# Patient Record
Sex: Male | Born: 1970
Health system: Southern US, Community
[De-identification: ages and names within clinical notes are randomized; demographics above are authoritative.]

## PROBLEM LIST (undated history)

## (undated) DIAGNOSIS — J342 Deviated nasal septum: Secondary | ICD-10-CM

## (undated) DIAGNOSIS — M8580 Other specified disorders of bone density and structure, unspecified site: Secondary | ICD-10-CM

## (undated) DIAGNOSIS — K219 Gastro-esophageal reflux disease without esophagitis: Secondary | ICD-10-CM

## (undated) DIAGNOSIS — S82401B Unspecified fracture of shaft of right fibula, initial encounter for open fracture type I or II: Secondary | ICD-10-CM

## (undated) DIAGNOSIS — M1990 Unspecified osteoarthritis, unspecified site: Secondary | ICD-10-CM

## (undated) DIAGNOSIS — N400 Enlarged prostate without lower urinary tract symptoms: Secondary | ICD-10-CM

## (undated) DIAGNOSIS — R0600 Dyspnea, unspecified: Secondary | ICD-10-CM

## (undated) DIAGNOSIS — S82201B Unspecified fracture of shaft of right tibia, initial encounter for open fracture type I or II: Secondary | ICD-10-CM

## (undated) DIAGNOSIS — M069 Rheumatoid arthritis, unspecified: Secondary | ICD-10-CM

## (undated) DIAGNOSIS — E7800 Pure hypercholesterolemia, unspecified: Secondary | ICD-10-CM

## (undated) DIAGNOSIS — G4730 Sleep apnea, unspecified: Secondary | ICD-10-CM

## (undated) HISTORY — DX: Unspecified fracture of shaft of right fibula, initial encounter for open fracture type I or II: S82.401B

## (undated) HISTORY — DX: Unspecified fracture of shaft of right tibia, initial encounter for open fracture type I or II: S82.201B

## (undated) HISTORY — PX: KNEE ARTHROSCOPY W/ MENISCECTOMY: SHX1879

## (undated) HISTORY — DX: Pure hypercholesterolemia, unspecified: E78.00

## (undated) HISTORY — DX: Other specified disorders of bone density and structure, unspecified site: M85.80

## (undated) HISTORY — DX: Benign prostatic hyperplasia without lower urinary tract symptoms: N40.0

## (undated) HISTORY — DX: Unspecified osteoarthritis, unspecified site: M19.90

## (undated) HISTORY — DX: Deviated nasal septum: J34.2

## (undated) HISTORY — DX: Dyspnea, unspecified: R06.00

## (undated) HISTORY — DX: Gastro-esophageal reflux disease without esophagitis: K21.9

## (undated) HISTORY — DX: Rheumatoid arthritis, unspecified: M06.9

## (undated) HISTORY — DX: Sleep apnea, unspecified: G47.30

---

## 1999-07-21 ENCOUNTER — Encounter: Payer: Self-pay | Admitting: Family Medicine

## 1999-07-21 ENCOUNTER — Encounter: Admission: RE | Admit: 1999-07-21 | Discharge: 1999-07-21 | Payer: Self-pay | Admitting: Family Medicine

## 2000-09-18 ENCOUNTER — Encounter: Admission: RE | Admit: 2000-09-18 | Discharge: 2000-09-18 | Payer: Self-pay | Admitting: Rheumatology

## 2000-09-18 ENCOUNTER — Encounter: Payer: Self-pay | Admitting: Rheumatology

## 2001-09-18 ENCOUNTER — Encounter: Payer: Self-pay | Admitting: Emergency Medicine

## 2001-09-18 ENCOUNTER — Encounter: Payer: Self-pay | Admitting: Orthopedic Surgery

## 2001-09-18 ENCOUNTER — Inpatient Hospital Stay (HOSPITAL_COMMUNITY): Admission: AC | Admit: 2001-09-18 | Discharge: 2001-09-25 | Payer: Self-pay

## 2001-09-19 ENCOUNTER — Encounter: Payer: Self-pay | Admitting: Orthopedic Surgery

## 2001-09-19 ENCOUNTER — Encounter: Payer: Self-pay | Admitting: Surgery

## 2001-09-25 ENCOUNTER — Inpatient Hospital Stay (HOSPITAL_COMMUNITY)
Admission: AD | Admit: 2001-09-25 | Discharge: 2001-10-05 | Payer: Self-pay | Admitting: Physical Medicine & Rehabilitation

## 2002-04-11 HISTORY — PX: WRIST ARTHROSCOPY: SUR100

## 2002-04-12 ENCOUNTER — Encounter: Admission: RE | Admit: 2002-04-12 | Discharge: 2002-04-12 | Payer: Self-pay | Admitting: Rheumatology

## 2002-04-12 ENCOUNTER — Encounter: Payer: Self-pay | Admitting: Rheumatology

## 2002-07-01 ENCOUNTER — Ambulatory Visit (HOSPITAL_BASED_OUTPATIENT_CLINIC_OR_DEPARTMENT_OTHER): Admission: RE | Admit: 2002-07-01 | Discharge: 2002-07-01 | Payer: Self-pay | Admitting: Orthopedic Surgery

## 2002-10-09 ENCOUNTER — Encounter: Payer: Self-pay | Admitting: Rheumatology

## 2002-10-09 ENCOUNTER — Encounter: Admission: RE | Admit: 2002-10-09 | Discharge: 2002-10-09 | Payer: Self-pay | Admitting: Rheumatology

## 2002-12-06 ENCOUNTER — Encounter: Admission: RE | Admit: 2002-12-06 | Discharge: 2002-12-06 | Payer: Self-pay | Admitting: Rheumatology

## 2002-12-06 ENCOUNTER — Encounter: Payer: Self-pay | Admitting: Rheumatology

## 2004-12-21 ENCOUNTER — Encounter: Admission: RE | Admit: 2004-12-21 | Discharge: 2005-03-21 | Payer: Self-pay | Admitting: Family Medicine

## 2006-09-13 ENCOUNTER — Ambulatory Visit (HOSPITAL_BASED_OUTPATIENT_CLINIC_OR_DEPARTMENT_OTHER): Admission: RE | Admit: 2006-09-13 | Discharge: 2006-09-13 | Payer: Self-pay | Admitting: Orthopedic Surgery

## 2009-11-03 ENCOUNTER — Encounter: Admission: RE | Admit: 2009-11-03 | Discharge: 2009-11-03 | Payer: Self-pay | Admitting: Gastroenterology

## 2010-08-24 NOTE — Op Note (Signed)
NAME:  [name], Gregory NO.:  [id number]   MEDICAL RECORD NO.:  [id number]          PATIENT TYPE:  AMB   LOCATION:  DSC                          FACILITY:  [facility]   PHYSICIAN:  Harvie Junior, M.D.   DATE OF BIRTH:  May 16, 1970   DATE OF PROCEDURE:  09/13/2006  DATE OF DISCHARGE:                               OPERATIVE REPORT   PREOPERATIVE DIAGNOSES:  1. Posterior horn medial meniscal tear.  2. Anterior horn lateral meniscal tear.   POSTOPERATIVE DIAGNOSES:  1. Posterior horn medial meniscal tear.  2. Anterior horn lateral meniscal tear.   PRINCIPAL PROCEDURE:  1. Partial posterior horn medial meniscectomy.  2. Anterior horn lateral meniscectomy, partial.   SURGEON:  Harvie Junior, M.D.   ASSISTANT:  Marshia Ly, P.A.   ANESTHESIA:  General.   BRIEF HISTORY:  [name] is 40 year old male with a long history of  having had right knee pain.  Examination showed that he had a positive  McMurray with concern of a medial meniscal tear.  MRI was obtained which  showed that he had both medial and lateral meniscal tears.  We talked  about treatment options and ultimately felt that operative intervention  was the most appropriate course of action.  He does take chronic  methotrexate and Enbrel for his rheumatoid arthritis and we felt it  appropriate to stop these medications for a week while he was having  arthroscopy, so these were not taken the week of surgery.  He is brought  to the operating room for this procedure.   PROCEDURE:  The patient is brought to the operating room.  After  adequate anesthesia was obtained with general anesthetic, the patient  was placed up on the operating room table.  The right leg was prepped  and draped in the usual sterile fashion.  Following this, routine  arthroscopic examination of the knee revealed that there was an obvious  posterior horn medial meniscal tear, flap felt tear.  This was debrided  back to a smooth and  stable rim.   Attention was then turned to the lateral meniscus where a the anterior  horn of the lateral meniscus was obviously torn.  This was debrided with  a combination of straight-biting forceps, left hand side-biting forceps  mainly.  Meniscus was contoured down with suction shaver.  A probe was  used to make sure anterior-lateral was stable, posterior-medial was  stable, these were.  The articular cartilage was really probed at length  and felt to be stable and no real significant areas of breakdown.  ACL  looked great.  We did a thorough lavage of the knee with 6 liters of  normal saline irrigation.  The knee was then copiously irrigated and  suctioned dry.  The arthroscopic portals were  closed with a bandage and a sterile compression dressing was applied.  The patient was taken to the recovery room, was noted to be in  satisfactory condition.   ESTIMATED BLOOD LOSS:  None.      Harvie Junior, M.D.  Electronically Signed     JLG/MEDQ  D:  09/13/2006  T:  09/13/2006  Job:  [id number]
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
## 2010-08-27 NOTE — Op Note (Signed)
NAME:  Gregory [name], Gregory [name]                          ACCOUNT NO.:  [id number]   MEDICAL RECORD NO.:  [id number]                   PATIENT TYPE:  AMB   LOCATION:  DSC                                  FACILITY:   PHYSICIAN:  Gregory [name], M.D.                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   POSTOPERATIVE DIAGNOSIS:  Degenerative joint disease of the wrist with torn  scapholunate ligament and torn triangular fibrocartilage.   OPERATION/PROCEDURE:  Debridement of wrist with debridement of torn  scapholunate ligament, interosseous ligament, debridement of triangular  fibrocartilage and debridement of multiple osteocartilaginous frayed  surfaces with a complete synovectomy of the wrist.   SURGEON:  Gregory [name], M.D.   ASSISTANT:  Gregory [name], P.A.   ANESTHESIA:  General.   BRIEF HISTORY:  Gregory [name] is a 40 year old male with a long history of  having prolonged rheumatoid arthritis.  He ultimately has had significant  wrist pain which was treated and improved with cortisone injection but  recurred.  Because of continued complaints of pain, MRI was obtained by Dr.  Coral [name].  The MRI showed that he had scapholunate ligament interosseous  tears as well as triangular fibrocartilage tear and degeneration with pannus  formation.  It was felt that this was the cause of the wrist pain.  We  ultimately talked to him about the fact that this was related to his  rheumatoid arthritis and that although we could not cure the disease, we  thought we could make his symptoms much better.  He ultimately decided to  come to the operating room for fixation.   DESCRIPTION OF PROCEDURE:  The patient was brought to the operating room.  After adequate anesthesia was obtained  with general anesthesia, the patient  was placed on the operating table.  The left wrist was then prepped and  draped in the usual sterile fashion.  Following this, routine wrist  arthroscopy was undertaken of the left wrist after exsanguination of the  extremity, tourniquet was inflated to 250 mmHg.  Following this, there was  extensive synovitis which was found throughout the wrist.  This was invading  most of the ligamentous tissue.  The triangular fibrocartilage was obviously  torn.  The scapholunate ligament was obviously torn. At this point we just  basically started from the ulnar side of the wrist and made a portal, just a  6-U portal  and debrided the synovitis out of the ulnar gutter as well as  debridement of the triangular fibrocartilage was undertaken from this far  medial portal.  Following this, attention was turned towards the central  portion of the wrist where debridement was undertaken of synovitis from the  anterior and posterior portions of the wrist as well as addressing the  scapholunate interosseous ligament which was debrided.  There was  essentially no articular cartilage on the proximal aspect of the lunate or  the scaphoid.  It really was devoid of all articular cartilage.  At this  point the camera was changed into the third compartment. Debridement of the  radial side of the wrist was undertaken and extensive synovectomy of the  radial gutter up around the radial styloid osteocartilaginous loose bodies  were debrided from the area of the radioscaphoid  articulation.  Following this, the wrist was copiously irrigated and  suctioned dry.  __________  portals were closed with a stitch.  Sterile  compressive dressing was applied as well as a wrist plaster.  The patient  was taken to the recovery room and was noted to be in satisfactory  condition.  Estimated blood loss for the procedure was none.                                                Gregory [name],  M.D.    Gregory [name]  D:  07/01/2002  T:  07/01/2002  Job:  [id number]   cc:   Gregory [name]. Coral [name], M.D.  [address]  [address]  [place]  [place] [address]  Fax: [phone]

## 2011-01-27 LAB — POCT HEMOGLOBIN-HEMACUE
Hemoglobin: 16.1
Operator id: 116011

## 2011-12-07 ENCOUNTER — Other Ambulatory Visit: Payer: Self-pay | Admitting: Family Medicine

## 2011-12-07 ENCOUNTER — Ambulatory Visit
Admission: RE | Admit: 2011-12-07 | Discharge: 2011-12-07 | Disposition: A | Discharge status: Home / Self Care | Payer: 59 | Source: Ambulatory Visit | Attending: Family Medicine | Admitting: Family Medicine

## 2011-12-07 ENCOUNTER — Other Ambulatory Visit (HOSPITAL_COMMUNITY): Payer: Self-pay | Admitting: Family Medicine

## 2011-12-07 DIAGNOSIS — M545 Low back pain, unspecified: Secondary | ICD-10-CM

## 2011-12-07 DIAGNOSIS — R0602 Shortness of breath: Secondary | ICD-10-CM

## 2011-12-14 ENCOUNTER — Ambulatory Visit (HOSPITAL_COMMUNITY)
Admission: RE | Admit: 2011-12-14 | Discharge: 2011-12-14 | Disposition: A | Discharge status: Home / Self Care | Payer: 59 | Source: Ambulatory Visit | Attending: Family Medicine | Admitting: Family Medicine

## 2011-12-14 DIAGNOSIS — R0602 Shortness of breath: Secondary | ICD-10-CM | POA: Insufficient documentation

## 2011-12-14 LAB — PULMONARY FUNCTION TEST

## 2011-12-14 MED ORDER — ALBUTEROL SULFATE (5 MG/ML) 0.5% IN NEBU
2.5000 mg | INHALATION_SOLUTION | Freq: Once | RESPIRATORY_TRACT | Status: AC
  Administered 2011-12-14: 2.5 mg via RESPIRATORY_TRACT

## 2012-01-18 ENCOUNTER — Encounter: Payer: Self-pay | Admitting: Internal Medicine

## 2012-01-19 ENCOUNTER — Encounter: Payer: Self-pay | Admitting: Internal Medicine

## 2012-01-19 ENCOUNTER — Ambulatory Visit (INDEPENDENT_AMBULATORY_CARE_PROVIDER_SITE_OTHER): Payer: 59 | Admitting: Internal Medicine

## 2012-01-19 VITALS — BP 128/68 | HR 71 | Temp 98.2°F | Ht 72.0 in | Wt 195.4 lb

## 2012-01-19 DIAGNOSIS — R0600 Dyspnea, unspecified: Secondary | ICD-10-CM

## 2012-01-19 DIAGNOSIS — R0609 Other forms of dyspnea: Secondary | ICD-10-CM

## 2012-01-19 DIAGNOSIS — R0989 Other specified symptoms and signs involving the circulatory and respiratory systems: Secondary | ICD-10-CM

## 2012-01-19 NOTE — Progress Notes (Signed)
Subjective:    Patient ID: Gregory [name], male    DOB: [birth year], 41 y.o.   MRN: [id number]  HPI  PCP is Lupita Raider, MD Rheumatologist - Dr Zenovia Jordan  IOV.01/19/2012 3 41 year old with RA on methtotrexate and enbrel  Reports cc dyspnea. On and off x  1-2 years. Spring 2012 dx with hiatal hernia and nexium helped with GERD but not with dyspnea. Stable since onset but past  month started on dimesta for nose blockage (dr Chilton Greathouse a month ago and few days ago) and feels dyspnea improved. Dyspnea severity is mild-moderate. Dyspnea brought on by unclear factors but has noticed it on exercise and treadmill which he thinks is not undue dyspnea.   Admits that when he cannot mout breathe for instance during talking or eating dyspnea is worse. Dyspnea can even happen at rest, episodically but he is unclear about details on this. . There is no associated panic sensation. Symptoms are associated with mouth breathing   There is associated snoring, and gasping with apneic spells reported to ENT by his girl friend but this is improved by dimesta.  THere is no associated cough, wheeze, chest tightness, sputum, hemoptysis, edema, pnd, orthopnea or weight loss  RA - diagnosed 11 years ago and he is on enbrel and Mtx x 10 years without complications per hx  LABS  Walking desaturation test 06/29/2011 185 feet x 3 laps: did not desaturate below 88% but went from 98% to 91%., HR response 68/min to 92/min   PFT -- mild reduction in FVC but otherwise normal PFT data  12/14/11  Fev1 3.9L/87%  FVC 4.4L/77%  BD response 4% fev1  Ratio 89/111%  fef 25-75% 129%  TLC 6.8/93%  DLCO 32/83%     LABS Creat 1.3mg % 11/30/11  HGb 15gm% 11/30/11   CXR   - done in last 2 months at PMD office - per report normal   Past Medical History  Diagnosis Date  . Rheumatoid arthritis   . Osteopenia   . Hypercholesterolemia   . GERD (gastroesophageal reflux disease)   . Deviated septum      Family History    Problem Relation Age of Onset  . Hypertension Father      History   Social History  . Marital Status: Single    Spouse Name: N/A    Number of Children: N/A  . Years of Education: N/A   Occupational History  . Not on file.   Social History Main Topics  . Smoking status: Never Smoker   . Smokeless tobacco: Not on file  . Alcohol Use: No  . Drug Use: No  . Sexually Active: Not on file   Other Topics Concern  . Not on file   Social History Narrative  . No narrative on file     Allergies  Allergen Reactions  . Amoxicillin      Outpatient Prescriptions Prior to Visit  Medication Sig Dispense Refill  . alfuzosin (UROXATRAL) 10 MG 24 hr tablet Take 10 mg by mouth daily.      . Calcium Carbonate-Vitamin D (CALCIUM 600+D) 600-400 MG-UNIT per tablet Take 1 tablet by mouth daily.      Marland Kitchen etanercept (ENBREL) 50 MG/ML injection Inject 50 mg into the skin once a week.      . folic acid (FOLVITE) 1 MG tablet Take 1 mg by mouth daily.      Marland Kitchen gabapentin (NEURONTIN) 300 MG capsule Take 300 mg by mouth 3 (three) times daily.      Marland Kitchen  methotrexate (RHEUMATREX) 2.5 MG tablet Take 17.5 mg by mouth once a week. (take 7 tablets weekly)       Caution:Chemotherapy. Protect from light.      . Multiple Vitamins-Minerals (MULTIVITAMIN & MINERAL PO) Take 1 tablet by mouth daily.          Review of Systems  Constitutional: Negative for fever and unexpected weight change.  HENT: Positive for congestion. Negative for ear pain, nosebleeds, sore throat, rhinorrhea, sneezing, trouble swallowing, dental problem, postnasal drip and sinus pressure.   Eyes: Negative for redness and itching.  Respiratory: Positive for shortness of breath. Negative for cough, chest tightness and wheezing.   Cardiovascular: Negative for palpitations and leg swelling.  Gastrointestinal: Negative for nausea and vomiting.  Genitourinary: Negative for dysuria.  Musculoskeletal: Positive for joint swelling.  Skin: Negative for  rash.  Neurological: Negative for headaches.  Hematological: Does not bruise/bleed easily.  Psychiatric/Behavioral: Negative for dysphoric mood. The patient is not nervous/anxious.        Objective:   Physical Exam  Nursing note and vitals reviewed. Constitutional: He is oriented to person, place, and time. He appears well-developed and well-nourished. No distress.  HENT:  Head: Normocephalic and atraumatic.  Right Ear: External ear normal.  Left Ear: External ear normal.  Mouth/Throat: Oropharynx is clear and moist. No oropharyngeal exudate.  Eyes: Conjunctivae normal and EOM are normal. Pupils are equal, round, and reactive to light. Right eye exhibits no discharge. Left eye exhibits no discharge. No scleral icterus.  Neck: Normal range of motion. Neck supple. No JVD present. No tracheal deviation present. No thyromegaly present.  Cardiovascular: Normal rate, regular rhythm and intact distal pulses.  Exam reveals no gallop and no friction rub.   No murmur heard. Pulmonary/Chest: Effort normal and breath sounds normal. No respiratory distress. He has no wheezes. He has no rales. He exhibits no tenderness.  Abdominal: Soft. Bowel sounds are normal. He exhibits no distension and no mass. There is no tenderness. There is no rebound and no guarding.  Musculoskeletal: Normal range of motion. He exhibits no edema and no tenderness.  Lymphadenopathy:    He has no cervical adenopathy.  Neurological: He is alert and oriented to person, place, and time. He has normal reflexes. No cranial nerve deficit. Coordination normal.  Skin: Skin is warm and dry. No rash noted. He is not diaphoretic. No erythema. No pallor.  Psychiatric: He has a normal mood and affect. His behavior is normal. Judgment and thought content normal.          Assessment & Plan:  \

## 2012-01-19 NOTE — Patient Instructions (Addendum)
Unclear why you are having shortness of breath Please have methacholine challenge test for asthma After this test complete, call our office, and I will go over results with you over phone Based on these results, might have you come in or have pulmonary bike stress test

## 2012-01-20 DIAGNOSIS — R0600 Dyspnea, unspecified: Secondary | ICD-10-CM | POA: Insufficient documentation

## 2012-01-20 NOTE — Assessment & Plan Note (Signed)
/  Not sure why he is dyspneic. His disease predisposes him to restrictive chest cage if he has t spine RA or ILD but clinically these are less likely. He could have asthma. He is desiring clarification. Will get metahcholine challenge. If negative, then might need CPST v CT imaging

## 2012-01-25 ENCOUNTER — Ambulatory Visit (HOSPITAL_COMMUNITY)
Admission: RE | Admit: 2012-01-25 | Discharge: 2012-01-25 | Disposition: A | Discharge status: Home / Self Care | Payer: 59 | Source: Ambulatory Visit | Attending: Internal Medicine | Admitting: Internal Medicine

## 2012-01-25 DIAGNOSIS — R0989 Other specified symptoms and signs involving the circulatory and respiratory systems: Secondary | ICD-10-CM | POA: Insufficient documentation

## 2012-01-25 DIAGNOSIS — R0609 Other forms of dyspnea: Secondary | ICD-10-CM | POA: Insufficient documentation

## 2012-01-25 LAB — PULMONARY FUNCTION TEST

## 2012-01-25 MED ORDER — ALBUTEROL SULFATE (5 MG/ML) 0.5% IN NEBU
2.5000 mg | INHALATION_SOLUTION | Freq: Once | RESPIRATORY_TRACT | Status: AC
  Administered 2012-01-25: 2.5 mg via RESPIRATORY_TRACT

## 2012-01-25 MED ORDER — SODIUM CHLORIDE 0.9 % IN NEBU
3.0000 mL | INHALATION_SOLUTION | Freq: Once | RESPIRATORY_TRACT | Status: AC
  Administered 2012-01-25: 3 mL via RESPIRATORY_TRACT

## 2012-01-25 MED ORDER — METHACHOLINE 0.25 MG/ML NEB SOLN
2.0000 mL | Freq: Once | RESPIRATORY_TRACT | Status: AC
  Administered 2012-01-25: 0.5 mg via RESPIRATORY_TRACT

## 2012-01-25 MED ORDER — METHACHOLINE 16 MG/ML NEB SOLN
2.0000 mL | Freq: Once | RESPIRATORY_TRACT | Status: AC
  Administered 2012-01-25: 32 mg via RESPIRATORY_TRACT

## 2012-01-25 MED ORDER — METHACHOLINE 1 MG/ML NEB SOLN
2.0000 mL | Freq: Once | RESPIRATORY_TRACT | Status: AC
  Administered 2012-01-25: 2 mg via RESPIRATORY_TRACT

## 2012-01-25 MED ORDER — METHACHOLINE 0.0625 MG/ML NEB SOLN
2.0000 mL | Freq: Once | RESPIRATORY_TRACT | Status: AC
  Administered 2012-01-25: 0.125 mg via RESPIRATORY_TRACT

## 2012-01-25 MED ORDER — METHACHOLINE 4 MG/ML NEB SOLN
2.0000 mL | Freq: Once | RESPIRATORY_TRACT | Status: AC
  Administered 2012-01-25: 8 mg via RESPIRATORY_TRACT

## 2012-02-28 ENCOUNTER — Telehealth: Payer: Self-pay | Admitting: Internal Medicine

## 2012-02-28 DIAGNOSIS — R0600 Dyspnea, unspecified: Secondary | ICD-10-CM

## 2012-02-28 DIAGNOSIS — M069 Rheumatoid arthritis, unspecified: Secondary | ICD-10-CM

## 2012-02-28 DIAGNOSIS — J849 Interstitial pulmonary disease, unspecified: Secondary | ICD-10-CM

## 2012-02-28 NOTE — Telephone Encounter (Signed)
Please let him know MCT 01/25/12 is normal. I have ordered High REs CT chest. Will call with results. IF negatie, will need CPST

## 2012-02-28 NOTE — Telephone Encounter (Signed)
Pt advised and order has been placed. Jennifer Castillo, CMA  

## 2012-03-01 ENCOUNTER — Other Ambulatory Visit: Payer: 59

## 2012-03-07 ENCOUNTER — Telehealth: Payer: Self-pay | Admitting: Internal Medicine

## 2012-03-07 ENCOUNTER — Ambulatory Visit (INDEPENDENT_AMBULATORY_CARE_PROVIDER_SITE_OTHER)
Admission: RE | Admit: 2012-03-07 | Discharge: 2012-03-07 | Disposition: A | Discharge status: Home / Self Care | Payer: 59 | Source: Ambulatory Visit | Attending: Internal Medicine | Admitting: Internal Medicine

## 2012-03-07 DIAGNOSIS — R0600 Dyspnea, unspecified: Secondary | ICD-10-CM

## 2012-03-07 DIAGNOSIS — J849 Interstitial pulmonary disease, unspecified: Secondary | ICD-10-CM

## 2012-03-07 DIAGNOSIS — R0609 Other forms of dyspnea: Secondary | ICD-10-CM

## 2012-03-07 DIAGNOSIS — R0989 Other specified symptoms and signs involving the circulatory and respiratory systems: Secondary | ICD-10-CM

## 2012-03-07 DIAGNOSIS — M069 Rheumatoid arthritis, unspecified: Secondary | ICD-10-CM

## 2012-03-07 DIAGNOSIS — J8410 Pulmonary fibrosis, unspecified: Secondary | ICD-10-CM

## 2012-03-07 NOTE — Telephone Encounter (Signed)
Ct Chest Wo Contrast  03/07/2012  *RADIOLOGY REPORT*  Clinical Data: Interstitial lung disease.  Shortness of breath, rheumatoid arthritis.  CT CHEST WITHOUT CONTRAST  Technique:  Multidetector CT imaging of the chest was performed following the standard protocol without IV contrast.  Comparison: None.  Findings: No pathologically enlarged mediastinal, hilar or axillary lymph nodes.  Heart size normal.  No pericardial effusion.  Lungs are clear.  No subpleural reticulation, traction bronchiectasis/bronchiolectasis, architectural distortion or honeycombing.  No air trapping on inspiratory and expiratory imaging.  No pleural fluid.  Airway is unremarkable.  Incidental imaging of the upper abdomen shows a tiny stone in the left kidney.  No obstruction.  No worrisome lytic or sclerotic lesions.  IMPRESSION:  1.  No evidence of interstitial lung disease.  No findings to explain the patient's shortness of breath. 2.  Tiny left renal stone.   Original Report Authenticated By: Leanna Battles, M.D.     Chest chest normal though there is inidental finding of tiny left kidney stone that he needs to talk to pmd about. No explaination for dyspnea. Needs CPST - ordered  Pleaste tell him abov and give appt to see me after cPST  Thanks MR

## 2012-03-16 NOTE — Telephone Encounter (Signed)
Pt is advised and wants to call back to schedule follow-up when he has his calender with him. Carron Curie, CMA

## 2012-03-28 ENCOUNTER — Ambulatory Visit (HOSPITAL_COMMUNITY): Payer: 59 | Attending: Internal Medicine

## 2012-03-28 DIAGNOSIS — R0609 Other forms of dyspnea: Secondary | ICD-10-CM | POA: Insufficient documentation

## 2012-03-28 DIAGNOSIS — R0600 Dyspnea, unspecified: Secondary | ICD-10-CM

## 2012-03-28 DIAGNOSIS — R0989 Other specified symptoms and signs involving the circulatory and respiratory systems: Secondary | ICD-10-CM | POA: Insufficient documentation

## 2012-04-05 DIAGNOSIS — R0602 Shortness of breath: Secondary | ICD-10-CM

## 2012-04-26 ENCOUNTER — Telehealth: Payer: Self-pay | Admitting: Internal Medicine

## 2012-04-26 NOTE — Telephone Encounter (Signed)
Pt is request CPST results done 04/05/12. Please advise MR thanks

## 2012-04-27 NOTE — Telephone Encounter (Signed)
He needs to come into discuss cpst results; kind of complicated to discuss over phone due to techniaclity of discussing the results thought rsults not alarming.We told him to followup but he said he has to look at his calendar and call back but he never did. Please give fu first avail to discuss

## 2012-04-27 NOTE — Telephone Encounter (Signed)
Called and spoke with pt and he is aware of appt scheduled to see MR on 1-30 to discuss CPST results.    Nothing further is needed.

## 2012-05-10 ENCOUNTER — Encounter: Payer: Self-pay | Admitting: Internal Medicine

## 2012-05-10 ENCOUNTER — Ambulatory Visit (INDEPENDENT_AMBULATORY_CARE_PROVIDER_SITE_OTHER): Payer: 59 | Admitting: Internal Medicine

## 2012-05-10 VITALS — BP 114/80 | HR 83 | Temp 98.2°F | Ht 72.0 in | Wt 192.4 lb

## 2012-05-10 DIAGNOSIS — R0989 Other specified symptoms and signs involving the circulatory and respiratory systems: Secondary | ICD-10-CM

## 2012-05-10 DIAGNOSIS — R0609 Other forms of dyspnea: Secondary | ICD-10-CM

## 2012-05-10 DIAGNOSIS — R0600 Dyspnea, unspecified: Secondary | ICD-10-CM

## 2012-05-10 NOTE — Progress Notes (Signed)
Subjective:    Patient ID: Gregory [name], male    DOB: 1970-11-26, 42 y.o.   MRN: [id number]  HPI PCP is Lupita Raider, MD Rheumatologist - Dr Zenovia Jordan  IOV.01/19/2012 [age] year old with RA on methtotrexate and enbrel  Reports cc dyspnea. On and off x  1-2 years. Spring 2012 dx with hiatal hernia and nexium helped with GERD but not with dyspnea. Stable since onset but past  month started on dimesta for nose blockage (dr Chilton Greathouse a month ago and few days ago) and feels dyspnea improved. Dyspnea severity is mild-moderate. Dyspnea brought on by unclear factors but has noticed it on exercise and treadmill which he thinks is not undue dyspnea.   Admits that when he cannot mout breathe for instance during talking or eating dyspnea is worse. Dyspnea can even happen at rest, episodically but he is unclear about details on this. . There is no associated panic sensation. Symptoms are associated with mouth breathing   There is associated snoring, and gasping with apneic spells reported to ENT by his girl friend but this is improved by dimesta.  THere is no associated cough, wheeze, chest tightness, sputum, hemoptysis, edema, pnd, orthopnea or weight loss  RA - diagnosed 11 years ago and he is on enbrel and Mtx x 10 years without complications per hx  LABS  Walking desaturation test 06/29/2011 185 feet x 3 laps: did not desaturate below 88% but went from 98% to 91%., HR response 68/min to 92/min   PFT -- mild reduction in FVC but otherwise normal PFT data  12/14/11  Fev1 3.9L/87%  FVC 4.4L/77%  BD response 4% fev1  Ratio 89/111%  fef 25-75% 129%  TLC 6.8/93%  DLCO 32/83%     LABS Creat 1.3mg % 11/30/11  HGb 15gm% 11/30/11   CXR   - done in last 2 months at [facility]D office - per report normal   Unclear why you are having shortness of breath  Please have methacholine challenge test for asthma  After this test complete, call our office, and I will go over results with you over phone   Based on these results, might have you come in or have pulmonary bike stress test   OV 05/10/2012  Returns for followup of dyspnea to discuss test results  - Please let him know MCT 01/25/12 is normal. So no evidence of asthma - CT scan of the chest 03/03/2012:   IMPRESSION:  1.  No evidence of interstitial lung disease.  No findings to explain the patient's shortness of breath. 2.  Tiny left renal stone.   Original Report Authenticated By: Leanna Battles, M.D.    - Get cardiopulmonary stress test:    - This test was abnormal. He again had a resting low forced vital capacity build mildly low. His effort was slightly submaximal but if one were to discount that and consider that he gave a good normal effort differential diagnosis is either deconditioning because he could not mount a good heart rate response and stopped or neuromuscular weakness as evidenced on basis of his forced vital capacity  - In interim his dyspnea persists he says that even while talking to me he feels that he needs to take a good deep breaths periodically  Review of Systems  Constitutional: Negative for fever and unexpected weight change.  HENT: Negative for ear pain, nosebleeds, congestion, sore throat, rhinorrhea, sneezing, trouble swallowing, dental problem, postnasal drip and sinus pressure.   Eyes: Negative for redness and itching.  Respiratory: Positive for shortness of breath. Negative for cough, chest tightness and wheezing.   Cardiovascular: Negative for palpitations and leg swelling.  Gastrointestinal: Negative for nausea and vomiting.  Genitourinary: Negative for dysuria.  Musculoskeletal: Negative for joint swelling.  Skin: Negative for rash.  Neurological: Negative for headaches.  Hematological: Does not bruise/bleed easily.  Psychiatric/Behavioral: Negative for dysphoric mood. The patient is not nervous/anxious.        Objective:   Physical Exam  Discussion only visit He is alert and  anxious       Assessment & Plan:

## 2012-05-10 NOTE — Assessment & Plan Note (Addendum)
Please let him know MCT 01/25/12 is normal. So no evidence of asthma - CT scan of the chest 03/03/2012:   IMPRESSION:  1.  No evidence of interstitial lung disease.  No findings to explain the patient's shortness of breath. 2.  Tiny left renal stone.   Original Report Authenticated By: Leanna Battles, M.D.    - Get cardiopulmonary stress test:    - This test was abnormal. He again had a resting low forced vital capacity build mildly low. His effort was slightly submaximal but if one were to discount that and consider that he gave a good normal effort differential diagnosis is either deconditioning because he could not mount a good heart rate response and stopped or neuromuscular weakness as evidenced on basis of his forced vital capacity   We discussed extensively the implications of these results. I've explained to him that the proper differential diagnosis is neuromuscular weakness either due to rheumatoid arthritis itself or possible drug interaction or  new neuromuscular disease. Alternative, is that he is deconditioned or has early coronary artery disease. Really doubt that he has early heart disease due to normal cardiac performance and the stress test.   See neurology consultation first and return for followup. His neurology evaluation is negative then we'll refer to pulmonary rehabilitation  Agreeable with the above plan   > 50% of this > 25 min visit spent in face to face counseling (15 min visit converted to 25 min)

## 2012-05-10 NOTE — Patient Instructions (Addendum)
Please see Neurology for ruling out neuromuscular issues causing your shortness of breath Return in 2 months Ok to go to gym and work out

## 2012-07-09 ENCOUNTER — Encounter: Payer: Self-pay | Admitting: Internal Medicine

## 2012-07-09 ENCOUNTER — Ambulatory Visit (INDEPENDENT_AMBULATORY_CARE_PROVIDER_SITE_OTHER): Payer: 59 | Admitting: Internal Medicine

## 2012-07-09 VITALS — BP 126/82 | HR 63 | Temp 98.1°F | Ht 72.0 in | Wt 192.0 lb

## 2012-07-09 DIAGNOSIS — R0609 Other forms of dyspnea: Secondary | ICD-10-CM

## 2012-07-09 DIAGNOSIS — R0600 Dyspnea, unspecified: Secondary | ICD-10-CM

## 2012-07-09 DIAGNOSIS — R0989 Other specified symptoms and signs involving the circulatory and respiratory systems: Secondary | ICD-10-CM

## 2012-07-09 NOTE — Assessment & Plan Note (Signed)
He is reporting resolution of dyspnea with self-directed exercise program. He still unsettled as to why he had dyspnea in the first place. We discussed the hypothesis of deviated blocked nasal septum causing dyspnea. His dural muscle evaluation is reportedly negative. He also feels that stress or acid reflux might have contributed to dyspnea. At this point in time no further followup is needed. We discussed that rheumatoid arthritis, methotrexate and possibly acid reflux might play etiological roles in future development of pulmonary fibrosis but there is no way to predict this order to surveillance. I did indicate to him that he should be in constant touch with his rheumatologist and monitor himself for methotrexate toxicity which over long time can affect the lungs. He is in agreement with this plan. He will watch his dyspnea and if it recurs he will return to the office. At this time I'm not to do any further followup  (> 50% of this 15 min visit spent in face to face counseling)

## 2012-07-09 NOTE — Patient Instructions (Addendum)
Glad you are better We discussed risk factors for pulmonary fibrosis - RA, Methotrexate and maybe acid reflux Monitor your symptoms REturn as needed

## 2012-07-09 NOTE — Progress Notes (Signed)
Subjective:    Patient ID: Gregory [name], male    DOB: Aug 02, 1970, 42 y.o.   MRN: [id number]  HPI PCP is Lupita Raider, MD Rheumatologist - Dr Zenovia Jordan  IOV.01/19/2012 [age] year old with RA on methtotrexate and enbrel  Reports cc dyspnea. On and off x  1-2 years. Spring 2012 dx with hiatal hernia and nexium helped with GERD but not with dyspnea. Stable since onset but past  month started on dimesta for nose blockage (dr Chilton Greathouse a month ago and few days ago) and feels dyspnea improved. Dyspnea severity is mild-moderate. Dyspnea brought on by unclear factors but has noticed it on exercise and treadmill which he thinks is not undue dyspnea.   Admits that when he cannot mout breathe for instance during talking or eating dyspnea is worse. Dyspnea can even happen at rest, episodically but he is unclear about details on this. . There is no associated panic sensation. Symptoms are associated with mouth breathing   There is associated snoring, and gasping with apneic spells reported to ENT by his girl friend but this is improved by dimesta.  THere is no associated cough, wheeze, chest tightness, sputum, hemoptysis, edema, pnd, orthopnea or weight loss  RA - diagnosed 11 years ago and he is on enbrel and Mtx x 10 years without complications per hx  LABS  Walking desaturation test 06/29/2011 185 feet x 3 laps: did not desaturate below 88% but went from 98% to 91%., HR response 68/min to 92/min   PFT -- mild reduction in FVC but otherwise normal PFT data  12/14/11  Fev1 3.9L/87%  FVC 4.4L/77%  BD response 4% fev1  Ratio 89/111%  fef 25-75% 129%  TLC 6.8/93%  DLCO 32/83%     LABS Creat 1.3mg % 11/30/11  HGb 15gm% 11/30/11   CXR   - done in last 2 months at [facility]D office - per report normal   Unclear why you are having shortness of breath  Please have methacholine challenge test for asthma  After this test complete, call our office, and I will go over results with you over phone   Based on these results, might have you come in or have pulmonary bike stress test   OV 05/10/2012  Returns for followup of dyspnea to discuss test results  - Please let him know MCT 01/25/12 is normal. So no evidence of asthma - CT scan of the chest 03/03/2012:   IMPRESSION:  1.  No evidence of interstitial lung disease.  No findings to explain the patient's shortness of breath. 2.  Tiny left renal stone.   Original Report Authenticated By: Leanna Battles, M.D.    - Get cardiopulmonary stress test:    - This test was abnormal. He again had a resting low forced vital capacity build mildly low. His effort was slightly submaximal but if one were to discount that and consider that he gave a good normal effort differential diagnosis is either deconditioning because he could not mount a good heart rate response and stopped or neuromuscular weakness as evidenced on basis of his forced vital capacity  - In interim his dyspnea persists he says that even while talking to me he feels that he needs to take a good deep breaths periodically   REC Please see Neurology for ruling out neuromuscular issues causing your shortness of breath  Return in 2 months  Ok to go to gym and work out   OV 07/09/2012   Followup dyspnea in the setting of methotrexate  and rheumatoid arthritis.  pulmonary stress test was normal  Since last visit he is now working out at the gym in his office. He says that his dyspnea is improved and almost resolved. He did see neurology and he has been cleared of neuromuscular disease. He is still somewhat unsettled as to why he had dyspnea. We did discuss that he had deviated nasal septum and a blocked nose that might be constituting to sensation of dyspnea. In addition he feels that his personal social stress levels might be constituting to dyspnea. He has acid reflux and is trying to diet to reduce the intensity of that and he wonders if this might be the cause of dyspnea. We discussed  the role of methotrexate, rheumatoid arthritis and acid reflux and the possible role of future pulmonary fibrosis for which he is at risk but currently this is not the cause for dyspnea   Past, Family, Social reviewed: no change since last visit      Review of Systems  Constitutional: Negative for fever and unexpected weight change.  HENT: Negative for ear pain, nosebleeds, congestion, sore throat, rhinorrhea, sneezing, trouble swallowing, dental problem, postnasal drip and sinus pressure.   Eyes: Negative for redness and itching.  Respiratory: Negative for cough, chest tightness, shortness of breath and wheezing.   Cardiovascular: Negative for palpitations and leg swelling.  Gastrointestinal: Negative for nausea and vomiting.  Genitourinary: Negative for dysuria.  Musculoskeletal: Negative for joint swelling.  Skin: Negative for rash.  Neurological: Negative for headaches.  Hematological: Does not bruise/bleed easily.  Psychiatric/Behavioral: Negative for dysphoric mood. The patient is not nervous/anxious.        Objective:   Physical Exam  Nursing note and vitals reviewed. Constitutional: He is oriented to person, place, and time. He appears well-developed and well-nourished. No distress.  HENT:  Head: Normocephalic and atraumatic.  Right Ear: External ear normal.  Left Ear: External ear normal.  Mouth/Throat: Oropharynx is clear and moist. No oropharyngeal exudate.  Deviated nasal septum  Eyes: Conjunctivae and EOM are normal. Pupils are equal, round, and reactive to light. Right eye exhibits no discharge. Left eye exhibits no discharge. No scleral icterus.  Neck: Normal range of motion. Neck supple. No JVD present. No tracheal deviation present. No thyromegaly present.  Cardiovascular: Normal rate, regular rhythm and intact distal pulses.  Exam reveals no gallop and no friction rub.   No murmur heard. Pulmonary/Chest: Effort normal and breath sounds normal. No respiratory  distress. He has no wheezes. He has no rales. He exhibits no tenderness.  Abdominal: Soft. Bowel sounds are normal. He exhibits no distension and no mass. There is no tenderness. There is no rebound and no guarding.  Musculoskeletal: Normal range of motion. He exhibits no edema and no tenderness.  Lymphadenopathy:    He has no cervical adenopathy.  Neurological: He is alert and oriented to person, place, and time. He has normal reflexes. No cranial nerve deficit. Coordination normal.  Skin: Skin is warm and dry. No rash noted. He is not diaphoretic. No erythema. No pallor.  Psychiatric: He has a normal mood and affect. His behavior is normal. Judgment and thought content normal.  Mildly anxious          Assessment & Plan:

## 2015-05-27 ENCOUNTER — Other Ambulatory Visit: Payer: Self-pay | Admitting: Family Medicine

## 2015-05-27 ENCOUNTER — Ambulatory Visit
Admission: RE | Admit: 2015-05-27 | Discharge: 2015-05-27 | Disposition: A | Discharge status: Home / Self Care | Payer: 59 | Source: Ambulatory Visit | Attending: Family Medicine | Admitting: Family Medicine

## 2015-05-27 DIAGNOSIS — A5202 Syphilitic aortitis: Secondary | ICD-10-CM

## 2015-05-27 DIAGNOSIS — R6882 Decreased libido: Secondary | ICD-10-CM | POA: Insufficient documentation

## 2015-06-23 DIAGNOSIS — M8580 Other specified disorders of bone density and structure, unspecified site: Secondary | ICD-10-CM | POA: Insufficient documentation

## 2015-06-23 DIAGNOSIS — R7989 Other specified abnormal findings of blood chemistry: Secondary | ICD-10-CM | POA: Insufficient documentation

## 2015-06-23 DIAGNOSIS — E782 Mixed hyperlipidemia: Secondary | ICD-10-CM | POA: Insufficient documentation

## 2015-06-23 DIAGNOSIS — K219 Gastro-esophageal reflux disease without esophagitis: Secondary | ICD-10-CM | POA: Insufficient documentation

## 2015-06-23 DIAGNOSIS — K5900 Constipation, unspecified: Secondary | ICD-10-CM | POA: Insufficient documentation

## 2015-06-23 DIAGNOSIS — M069 Rheumatoid arthritis, unspecified: Secondary | ICD-10-CM | POA: Insufficient documentation

## 2015-06-23 DIAGNOSIS — N400 Enlarged prostate without lower urinary tract symptoms: Secondary | ICD-10-CM | POA: Insufficient documentation

## 2015-06-24 ENCOUNTER — Encounter: Payer: Self-pay | Admitting: Cardiology

## 2015-06-24 ENCOUNTER — Ambulatory Visit (INDEPENDENT_AMBULATORY_CARE_PROVIDER_SITE_OTHER): Payer: 59 | Admitting: Cardiology

## 2015-06-24 VITALS — BP 118/80 | HR 70 | Ht 72.0 in | Wt 201.8 lb

## 2015-06-24 DIAGNOSIS — E785 Hyperlipidemia, unspecified: Secondary | ICD-10-CM | POA: Diagnosis not present

## 2015-06-24 DIAGNOSIS — R0600 Dyspnea, unspecified: Secondary | ICD-10-CM

## 2015-06-24 DIAGNOSIS — K219 Gastro-esophageal reflux disease without esophagitis: Secondary | ICD-10-CM

## 2015-06-24 DIAGNOSIS — E7849 Other hyperlipidemia: Secondary | ICD-10-CM

## 2015-06-24 DIAGNOSIS — R079 Chest pain, unspecified: Secondary | ICD-10-CM | POA: Diagnosis not present
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
## 2015-06-24 NOTE — Patient Instructions (Addendum)
Medication Instructions:  The current medical regimen is effective;  continue present plan and medications.  Testing/Procedures: Your physician has requested that you have an exercise tolerance test. For further information please visit [email or web address]. Please also follow instruction sheet, as given.  Your physician has requested that you have an echocardiogram. Echocardiography is a painless test that uses sound waves to create images of your heart. It provides your doctor with information about the size and shape of your heart and how well your heart's chambers and valves are working. This procedure takes approximately one hour. There are no restrictions for this procedure.  Follow-Up: Follow up in 1 year with Dr. Marlou Porch.  You will receive a letter in the mail 2 months before you are due.  Please call us when you receive this letter to schedule your follow up appointment.  If you need a refill on your cardiac medications before your next appointment, please call your pharmacy.  Thank you for choosing [facility]!!

## 2015-07-14 ENCOUNTER — Other Ambulatory Visit: Payer: Self-pay

## 2015-07-14 ENCOUNTER — Ambulatory Visit (HOSPITAL_COMMUNITY): Payer: 59 | Attending: Cardiology

## 2015-07-14 ENCOUNTER — Ambulatory Visit (INDEPENDENT_AMBULATORY_CARE_PROVIDER_SITE_OTHER): Payer: 59

## 2015-07-14 DIAGNOSIS — E785 Hyperlipidemia, unspecified: Secondary | ICD-10-CM | POA: Insufficient documentation

## 2015-07-14 DIAGNOSIS — R079 Chest pain, unspecified: Secondary | ICD-10-CM

## 2015-07-14 DIAGNOSIS — I340 Nonrheumatic mitral (valve) insufficiency: Secondary | ICD-10-CM | POA: Insufficient documentation

## 2015-07-14 DIAGNOSIS — Z8249 Family history of ischemic heart disease and other diseases of the circulatory system: Secondary | ICD-10-CM | POA: Insufficient documentation

## 2015-07-14 LAB — EXERCISE TOLERANCE TEST
Estimated workload: 13.4 METS
Exercise duration (min): 11 min
Exercise duration (sec): 0 s
MPHR: 177 {beats}/min
Peak HR: 166 {beats}/min
Percent HR: 95 %
Percent of predicted max HR: 93 %
RPE: 19
Rest HR: 68 {beats}/min
Stage 1 DBP: 75 mmHg
Stage 1 Grade: 0 %
Stage 1 HR: 82 {beats}/min
Stage 1 SBP: 113 mmHg
Stage 1 Speed: 0 mph
Stage 2 Grade: 0 %
Stage 2 HR: 90 {beats}/min
Stage 2 Speed: 0.8 mph
Stage 3 Grade: 0 %
Stage 3 HR: 90 {beats}/min
Stage 3 Speed: 1 mph
Stage 4 Grade: 10 %
Stage 4 HR: 101 {beats}/min
Stage 4 Speed: 1.7 mph
Stage 5 DBP: 68 mmHg
Stage 5 Grade: 12 %
Stage 5 HR: 118 {beats}/min
Stage 5 SBP: 123 mmHg
Stage 5 Speed: 2.5 mph
Stage 6 DBP: 71 mmHg
Stage 6 Grade: 14 %
Stage 6 HR: 134 {beats}/min
Stage 6 SBP: 147 mmHg
Stage 6 Speed: 3.4 mph
Stage 7 Grade: 16 %
Stage 7 HR: 166 {beats}/min
Stage 7 Speed: 4.2 mph
Stage 8 DBP: 63 mmHg
Stage 8 Grade: 0 %
Stage 8 HR: 137 {beats}/min
Stage 8 SBP: 133 mmHg
Stage 8 Speed: 1.5 mph
Stage 9 DBP: 77 mmHg
Stage 9 Grade: 0 %
Stage 9 HR: 108 {beats}/min
Stage 9 SBP: 132 mmHg
Stage 9 Speed: 0 mph

## 2016-05-25 DIAGNOSIS — M0579 Rheumatoid arthritis with rheumatoid factor of multiple sites without organ or systems involvement: Secondary | ICD-10-CM | POA: Diagnosis not present

## 2016-05-25 DIAGNOSIS — M2550 Pain in unspecified joint: Secondary | ICD-10-CM | POA: Diagnosis not present

## 2016-05-25 DIAGNOSIS — Z79899 Other long term (current) drug therapy: Secondary | ICD-10-CM | POA: Diagnosis not present

## 2016-06-01 DIAGNOSIS — Z79899 Other long term (current) drug therapy: Secondary | ICD-10-CM | POA: Diagnosis not present

## 2016-06-01 DIAGNOSIS — M069 Rheumatoid arthritis, unspecified: Secondary | ICD-10-CM | POA: Diagnosis not present

## 2016-06-01 DIAGNOSIS — E782 Mixed hyperlipidemia: Secondary | ICD-10-CM | POA: Diagnosis not present

## 2016-06-01 DIAGNOSIS — Z0000 Encounter for general adult medical examination without abnormal findings: Secondary | ICD-10-CM | POA: Diagnosis not present

## 2016-07-11 DIAGNOSIS — H524 Presbyopia: Secondary | ICD-10-CM | POA: Diagnosis not present

## 2016-08-31 DIAGNOSIS — Z79899 Other long term (current) drug therapy: Secondary | ICD-10-CM | POA: Diagnosis not present

## 2016-08-31 DIAGNOSIS — M2550 Pain in unspecified joint: Secondary | ICD-10-CM | POA: Diagnosis not present

## 2016-08-31 DIAGNOSIS — M0579 Rheumatoid arthritis with rheumatoid factor of multiple sites without organ or systems involvement: Secondary | ICD-10-CM | POA: Diagnosis not present

## 2016-08-31 DIAGNOSIS — E782 Mixed hyperlipidemia: Secondary | ICD-10-CM | POA: Diagnosis not present

## 2016-12-05 DIAGNOSIS — M79641 Pain in right hand: Secondary | ICD-10-CM | POA: Diagnosis not present

## 2016-12-05 DIAGNOSIS — Z79899 Other long term (current) drug therapy: Secondary | ICD-10-CM | POA: Diagnosis not present

## 2016-12-05 DIAGNOSIS — M2550 Pain in unspecified joint: Secondary | ICD-10-CM | POA: Diagnosis not present

## 2016-12-05 DIAGNOSIS — M0579 Rheumatoid arthritis with rheumatoid factor of multiple sites without organ or systems involvement: Secondary | ICD-10-CM | POA: Diagnosis not present

## 2016-12-05 DIAGNOSIS — M79642 Pain in left hand: Secondary | ICD-10-CM | POA: Diagnosis not present

## 2017-03-07 DIAGNOSIS — Z79899 Other long term (current) drug therapy: Secondary | ICD-10-CM | POA: Diagnosis not present

## 2017-03-07 DIAGNOSIS — M2550 Pain in unspecified joint: Secondary | ICD-10-CM | POA: Diagnosis not present

## 2017-03-07 DIAGNOSIS — M0579 Rheumatoid arthritis with rheumatoid factor of multiple sites without organ or systems involvement: Secondary | ICD-10-CM | POA: Diagnosis not present

## 2017-06-05 DIAGNOSIS — Z79899 Other long term (current) drug therapy: Secondary | ICD-10-CM | POA: Diagnosis not present

## 2017-06-05 DIAGNOSIS — M0579 Rheumatoid arthritis with rheumatoid factor of multiple sites without organ or systems involvement: Secondary | ICD-10-CM | POA: Diagnosis not present

## 2017-06-05 DIAGNOSIS — M2550 Pain in unspecified joint: Secondary | ICD-10-CM | POA: Diagnosis not present

## 2017-06-07 DIAGNOSIS — Z0000 Encounter for general adult medical examination without abnormal findings: Secondary | ICD-10-CM | POA: Diagnosis not present

## 2017-06-07 DIAGNOSIS — E782 Mixed hyperlipidemia: Secondary | ICD-10-CM | POA: Diagnosis not present

## 2017-06-07 DIAGNOSIS — Z131 Encounter for screening for diabetes mellitus: Secondary | ICD-10-CM | POA: Diagnosis not present

## 2017-07-12 DIAGNOSIS — G4733 Obstructive sleep apnea (adult) (pediatric): Secondary | ICD-10-CM | POA: Diagnosis not present

## 2017-07-27 DIAGNOSIS — G4733 Obstructive sleep apnea (adult) (pediatric): Secondary | ICD-10-CM | POA: Diagnosis not present

## 2017-08-26 DIAGNOSIS — G4733 Obstructive sleep apnea (adult) (pediatric): Secondary | ICD-10-CM | POA: Diagnosis not present

## 2017-09-26 DIAGNOSIS — G4733 Obstructive sleep apnea (adult) (pediatric): Secondary | ICD-10-CM | POA: Diagnosis not present

## 2017-09-28 DIAGNOSIS — M0579 Rheumatoid arthritis with rheumatoid factor of multiple sites without organ or systems involvement: Secondary | ICD-10-CM | POA: Diagnosis not present

## 2017-09-28 DIAGNOSIS — M2550 Pain in unspecified joint: Secondary | ICD-10-CM | POA: Diagnosis not present

## 2017-09-28 DIAGNOSIS — Z79899 Other long term (current) drug therapy: Secondary | ICD-10-CM | POA: Diagnosis not present

## 2017-10-24 DIAGNOSIS — G4733 Obstructive sleep apnea (adult) (pediatric): Secondary | ICD-10-CM | POA: Diagnosis not present

## 2017-10-26 DIAGNOSIS — G4733 Obstructive sleep apnea (adult) (pediatric): Secondary | ICD-10-CM | POA: Diagnosis not present

## 2018-01-09 DIAGNOSIS — M0579 Rheumatoid arthritis with rheumatoid factor of multiple sites without organ or systems involvement: Secondary | ICD-10-CM | POA: Diagnosis not present

## 2018-01-09 DIAGNOSIS — Z79899 Other long term (current) drug therapy: Secondary | ICD-10-CM | POA: Diagnosis not present

## 2018-01-09 DIAGNOSIS — M2550 Pain in unspecified joint: Secondary | ICD-10-CM | POA: Diagnosis not present

## 2018-01-23 DIAGNOSIS — G4733 Obstructive sleep apnea (adult) (pediatric): Secondary | ICD-10-CM | POA: Diagnosis not present

## 2018-04-17 DIAGNOSIS — Z79899 Other long term (current) drug therapy: Secondary | ICD-10-CM | POA: Diagnosis not present

## 2018-04-17 DIAGNOSIS — M2550 Pain in unspecified joint: Secondary | ICD-10-CM | POA: Diagnosis not present

## 2018-04-17 DIAGNOSIS — Z6830 Body mass index (BMI) 30.0-30.9, adult: Secondary | ICD-10-CM | POA: Diagnosis not present

## 2018-04-17 DIAGNOSIS — M0579 Rheumatoid arthritis with rheumatoid factor of multiple sites without organ or systems involvement: Secondary | ICD-10-CM | POA: Diagnosis not present

## 2018-06-04 ENCOUNTER — Other Ambulatory Visit: Payer: Self-pay

## 2018-06-04 ENCOUNTER — Emergency Department (HOSPITAL_COMMUNITY): Payer: 59

## 2018-06-04 ENCOUNTER — Emergency Department (HOSPITAL_COMMUNITY)
Admission: EM | Admit: 2018-06-04 | Discharge: 2018-06-05 | Disposition: A | Discharge status: Home / Self Care | Payer: 59 | Attending: Emergency Medicine | Admitting: Emergency Medicine

## 2018-06-04 ENCOUNTER — Encounter (HOSPITAL_COMMUNITY): Payer: Self-pay | Admitting: Emergency Medicine

## 2018-06-04 DIAGNOSIS — Z79899 Other long term (current) drug therapy: Secondary | ICD-10-CM | POA: Diagnosis not present

## 2018-06-04 DIAGNOSIS — R1031 Right lower quadrant pain: Secondary | ICD-10-CM | POA: Diagnosis not present

## 2018-06-04 DIAGNOSIS — R52 Pain, unspecified: Secondary | ICD-10-CM | POA: Diagnosis not present

## 2018-06-04 DIAGNOSIS — N132 Hydronephrosis with renal and ureteral calculous obstruction: Secondary | ICD-10-CM | POA: Diagnosis not present

## 2018-06-04 DIAGNOSIS — R109 Unspecified abdominal pain: Secondary | ICD-10-CM | POA: Diagnosis not present

## 2018-06-04 DIAGNOSIS — R1084 Generalized abdominal pain: Secondary | ICD-10-CM | POA: Diagnosis not present

## 2018-06-04 DIAGNOSIS — N201 Calculus of ureter: Secondary | ICD-10-CM | POA: Insufficient documentation

## 2018-06-04 LAB — COMPREHENSIVE METABOLIC PANEL
ALT: 38 U/L (ref 0–44)
AST: 29 U/L (ref 15–41)
Albumin: 4.2 g/dL (ref 3.5–5.0)
Alkaline Phosphatase: 45 U/L (ref 38–126)
Anion gap: 12 (ref 5–15)
BUN: 17 mg/dL (ref 6–20)
CO2: 24 mmol/L (ref 22–32)
Calcium: 9.4 mg/dL (ref 8.9–10.3)
Chloride: 103 mmol/L (ref 98–111)
Creatinine, Ser: 1.33 mg/dL — ABNORMAL HIGH (ref 0.61–1.24)
GFR calc Af Amer: >60 mL/min (ref >60)
GFR calc non Af Amer: >60 mL/min (ref >60)
Glucose, Bld: 138 mg/dL — ABNORMAL HIGH (ref 70–99)
Potassium: 3.7 mmol/L (ref 3.5–5.1)
Sodium: 139 mmol/L (ref 135–145)
Total Bilirubin: 1.7 mg/dL — ABNORMAL HIGH (ref 0.3–1.2)
Total Protein: 7.6 g/dL (ref 6.5–8.1)

## 2018-06-04 LAB — LIPASE, BLOOD: Lipase: 37 U/L (ref 11–51)

## 2018-06-04 LAB — URINALYSIS, ROUTINE W REFLEX MICROSCOPIC
Bacteria, UA: NONE SEEN
Bilirubin Urine: NEGATIVE
Glucose, UA: NEGATIVE mg/dL
Ketones, ur: 5 mg/dL — AB
Leukocytes,Ua: NEGATIVE
Nitrite: NEGATIVE
Protein, ur: 30 mg/dL — AB
RBC / HPF: >50 RBC/hpf — ABNORMAL HIGH (ref 0–5)
Specific Gravity, Urine: 1.018 (ref 1.005–1.030)
pH: 8 (ref 5.0–8.0)

## 2018-06-04 LAB — CBC
HCT: 48.5 % (ref 39.0–52.0)
Hemoglobin: 15.9 g/dL (ref 13.0–17.0)
MCH: 30.3 pg (ref 26.0–34.0)
MCHC: 32.8 g/dL (ref 30.0–36.0)
MCV: 92.6 fL (ref 80.0–100.0)
Platelets: 297 10*3/uL (ref 150–400)
RBC: 5.24 MIL/uL (ref 4.22–5.81)
RDW: 13.1 % (ref 11.5–15.5)
WBC: 16.8 10*3/uL — ABNORMAL HIGH (ref 4.0–10.5)
nRBC: 0 % (ref 0.0–0.2)

## 2018-06-04 MED ORDER — SODIUM CHLORIDE 0.9 % IV BOLUS
1000.0000 mL | Freq: Once | INTRAVENOUS | Status: AC
  Administered 2018-06-04: 1000 mL via INTRAVENOUS

## 2018-06-04 MED ORDER — SODIUM CHLORIDE 0.9% FLUSH
3.0000 mL | Freq: Once | INTRAVENOUS | Status: AC
  Administered 2018-06-04: 3 mL via INTRAVENOUS

## 2018-06-04 NOTE — ED Notes (Signed)
RN informed ok for Visitor to come back

## 2018-06-04 NOTE — ED Triage Notes (Signed)
Patient from home, gradual onset of right lower quadrant of abdominal pain for the last two hours.  He has had 3 BMs and vomited x3-4.  Tender to palpation in right lower quadrant.  No nausea at this time.  He has had a total 148mcg of fentanyl en route to ED with EMS.  Pain is 4/10 at this time.

## 2018-06-04 NOTE — ED Provider Notes (Signed)
[facility] EMERGENCY DEPARTMENT Provider Note   CSN: [id number] Arrival date & time: 06/04/18  2052    History   Chief Complaint Chief Complaint  Patient presents with  . Abdominal Pain    HPI Gregory [name] is a 48 y.o. male.     HPI  48 year old male presents with RLQ pain. Started about 7 pm. He was painting and felt like he suddenly had to have a bowel movement. Had a small BM, but still had RLQ pain. Had another BM and eventually a soft one, but no diarrhea. Vomited about 4 times. Felt like he had a fever, and couldn't stop shaking. Pain has increased. No radiation. Some pain in right back. EMS was called and gave IV fentanyl. Pain now is dull/sore and about 2/10. No urinary symptoms.   Past Medical History:  Diagnosis Date  . BPH (benign prostatic hyperplasia)   . Deviated septum   . Dyspnea   . GERD (gastroesophageal reflux disease)   . Hypercholesterolemia   . Osteopenia   . Rheumatoid arthritis(714.0)   . Tibia and fibula open fracture, right     Patient Active Problem List   Diagnosis Date Noted  . Benign fibroma of prostate 06/23/2015  . CN (constipation) 06/23/2015  . Gastro-esophageal reflux disease without esophagitis 06/23/2015  . Decreased testosterone level 06/23/2015  . Combined fat and carbohydrate induced hyperlipemia 06/23/2015  . Other specified disorders of bone density and structure, unspecified site 06/23/2015  . Rheumatoid arthritis ([place]) 06/23/2015  . Decreased libido 05/27/2015  . Dyspnea 01/20/2012    Past Surgical History:  Procedure Laterality Date  . WRIST ARTHROSCOPY Left 2004        Home Medications    Prior to Admission medications   Medication Sig Start Date End Date Taking? Authorizing Provider  Calcium Carbonate-Vitamin D (CALCIUM 600+D) 600-400 MG-UNIT per tablet Take 1 tablet by mouth daily.   Yes [provider]  etanercept (ENBREL SURECLICK) 50 MG/ML injection Inject 50 mg into the skin  every Thursday.  06/06/12  Yes [provider]  folic acid (FOLVITE) 1 MG tablet Take 3 mg by mouth daily. Patient takes 2 tablets daily and 3 tablets on Thursday and Friday   Yes [provider]  Methotrexate Sodium (METHOTREXATE, PF,) 50 MG/2ML injection Inject 25 mg into the muscle every Thursday. 05/07/18  Yes [provider]  Multiple Vitamins-Minerals (MULTIVITAMIN & MINERAL PO) Take 1 tablet by mouth daily.   Yes [provider]  Omega-3 Fatty Acids (FISH OIL PO) Take 1 capsule by mouth daily.   Yes [provider]  Pitavastatin Calcium (LIVALO) 1 MG TABS Take 1 mg by mouth daily.   Yes [provider]  VOLTAREN 1 % GEL Apply 2 g topically 4 (four) times daily as needed (for pain).  01/15/18  Yes [provider]  HYDROcodone-acetaminophen (NORCO) 5-325 MG tablet Take 1 tablet by mouth every 4 (four) hours as needed for severe pain. 06/05/18   Sherwood Gambler, MD    Family History Family History  Problem Relation Age of Onset  . Hypertension Father   . Atrial fibrillation Father   . Hyperlipidemia Mother   . Heart attack Paternal Grandfather   . CAD Cousin   . Heart disease Paternal Aunt     Social History Social History   Tobacco Use  . Smoking status: Never Smoker  . Smokeless tobacco: Never Used  Substance Use Topics  . Alcohol use: No  .  Drug use: No     Allergies   Amoxicillin; Pravastatin; Rosuvastatin; and Simvastatin   Review of Systems Review of Systems  Constitutional: Positive for fever (subjective).  Gastrointestinal: Positive for abdominal pain and vomiting.  Genitourinary: Negative for dysuria and hematuria.  Musculoskeletal: Positive for back pain.  All other systems reviewed and are negative.    Physical Exam Updated Vital Signs BP (!) 131/92   Pulse 99   Temp 97.8 F (36.6 C) (Oral)   Resp 16   SpO2 100%   Physical Exam Vitals signs and nursing note reviewed.  Constitutional:        General: He is not in acute distress.    Appearance: He is well-developed. He is not ill-appearing or diaphoretic.  HENT:     Head: Normocephalic and atraumatic.     Right Ear: External ear normal.     Left Ear: External ear normal.     Nose: Nose normal.  Eyes:     General:        Right eye: No discharge.        Left eye: No discharge.  Neck:     Musculoskeletal: Neck supple.  Cardiovascular:     Rate and Rhythm: Normal rate and regular rhythm.     Heart sounds: Normal heart sounds.  Pulmonary:     Effort: Pulmonary effort is normal.     Breath sounds: Normal breath sounds.  Abdominal:     Palpations: Abdomen is soft.     Tenderness: There is no abdominal tenderness. There is no right CVA tenderness or left CVA tenderness.  Skin:    General: Skin is warm and dry.  Neurological:     Mental Status: He is alert.  Psychiatric:        Mood and Affect: Mood is not anxious.      ED Treatments / Results  Labs (all labs ordered are listed, but only abnormal results are displayed) Labs Reviewed  COMPREHENSIVE METABOLIC PANEL - Abnormal; Notable for the following components:      Result Value   Glucose, Bld 138 (*)    Creatinine, Ser 1.33 (*)    Total Bilirubin 1.7 (*)    All other components within normal limits  CBC - Abnormal; Notable for the following components:   WBC 16.8 (*)    All other components within normal limits  URINALYSIS, ROUTINE W REFLEX MICROSCOPIC - Abnormal; Notable for the following components:   APPearance HAZY (*)    Hgb urine dipstick LARGE (*)    Ketones, ur 5 (*)    Protein, ur 30 (*)    RBC / HPF >50 (*)    All other components within normal limits  LIPASE, BLOOD    EKG None  Radiology Ct Renal Stone Study  Result Date: 06/04/2018 CLINICAL DATA:  Right lower quadrant and flank pain EXAM: CT ABDOMEN AND PELVIS WITHOUT CONTRAST TECHNIQUE: Multidetector CT imaging of the abdomen and pelvis was performed following the standard protocol  without IV contrast. COMPARISON:  08/11/2010 FINDINGS: Lower chest: Lung bases are clear. No effusions. Heart is normal size. Hepatobiliary: No focal hepatic abnormality. Gallbladder unremarkable. Pancreas: No focal abnormality or ductal dilatation. Spleen: No focal abnormality.  Normal size. Adrenals/Urinary Tract: Mild right hydronephrosis due to a 3 mm mid right ureteral stone. No additional renal or ureteral stones. No hydronephrosis on the left. Adrenal glands and urinary bladder unremarkable. Stomach/Bowel: Normal appendix. Stomach, large and small bowel grossly unremarkable. Vascular/Lymphatic: No evidence of aneurysm or  adenopathy. Reproductive: No visible focal abnormality. Other: No free fluid or free air. Small left inguinal hernia containing fat. Musculoskeletal: No acute bony abnormality. IMPRESSION: 3 mm mid right ureteral stone.  Mild hydronephrosis. Electronically Signed   By: Rolm Baptise M.D.   On: 06/04/2018 23:57    Procedures Procedures (including critical care time)  Medications Ordered in ED Medications  sodium chloride flush (NS) 0.9 % injection 3 mL (3 mLs Intravenous Given 06/04/18 2111)  sodium chloride 0.9 % bolus 1,000 mL (0 mLs Intravenous Stopped 06/04/18 2240)     Initial Impression / Assessment and Plan / ED Course  I have reviewed the triage vital signs and the nursing notes.  Pertinent labs & imaging results that were available during my care of the patient were reviewed by me and considered in my medical decision making (see chart for details).        CT confirms ureteral stone and there is no other obvious pathology such as appendicitis.  Pain is controlled.  I have discussed using ibuprofen and will give Norco for breakthrough pain.  He otherwise appears stable for discharge home and I have low suspicion for an infected stone.  Final Clinical Impressions(s) / ED Diagnoses   Final diagnoses:  Right ureteral stone    ED Discharge Orders         Ordered     HYDROcodone-acetaminophen (NORCO) 5-325 MG tablet  Every 4 hours PRN     06/05/18 0023           Sherwood Gambler, MD 06/05/18 0025

## 2018-06-05 MED ORDER — HYDROCODONE-ACETAMINOPHEN 5-325 MG PO TABS: 1.0000 | ORAL_TABLET | ORAL | 0 refills | Status: DC | PRN

## 2018-06-05 NOTE — Discharge Instructions (Signed)
If you develop fever, burning when you urinate, vomiting, uncontrollable pain, or any other new/concerning symptoms then return to the ER for evaluation.  Sure to take ibuprofen as well for your pain.

## 2018-06-13 DIAGNOSIS — Z0000 Encounter for general adult medical examination without abnormal findings: Secondary | ICD-10-CM | POA: Diagnosis not present

## 2018-06-13 DIAGNOSIS — E782 Mixed hyperlipidemia: Secondary | ICD-10-CM | POA: Diagnosis not present

## 2018-07-31 DIAGNOSIS — M2550 Pain in unspecified joint: Secondary | ICD-10-CM | POA: Diagnosis not present

## 2018-07-31 DIAGNOSIS — M0579 Rheumatoid arthritis with rheumatoid factor of multiple sites without organ or systems involvement: Secondary | ICD-10-CM | POA: Diagnosis not present

## 2018-07-31 DIAGNOSIS — Z79899 Other long term (current) drug therapy: Secondary | ICD-10-CM | POA: Diagnosis not present

## 2020-03-25 IMAGING — CT CT RENAL STONE PROTOCOL
2 of 4 series · 17 of 46 positions shown, 19 images · non-contrast
Comparison: 08/11/2010

CLINICAL DATA: Right lower quadrant and flank pain

EXAM:
CT ABDOMEN AND PELVIS WITHOUT CONTRAST
TECHNIQUE: Multidetector CT imaging of the abdomen and pelvis was performed
following the standard protocol without IV contrast.

[Series 3: ap without · axial · non-contrast · 0.81mm/px · z∈[-499,-54]mm · 14 of 99 slices shown, 16 images]
[im 5/99  soft-tissue]
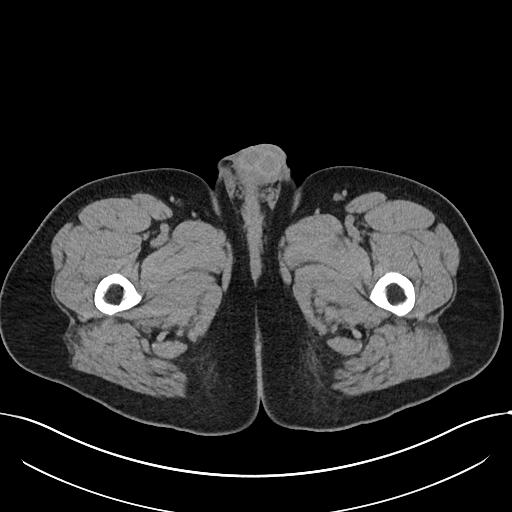
[im 5/99  bone]
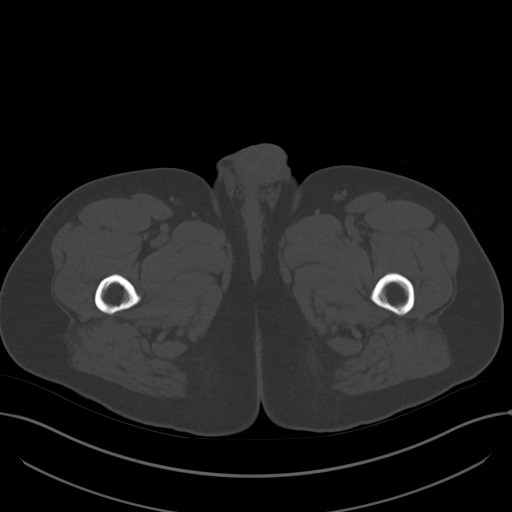
[im 15/99  soft-tissue]
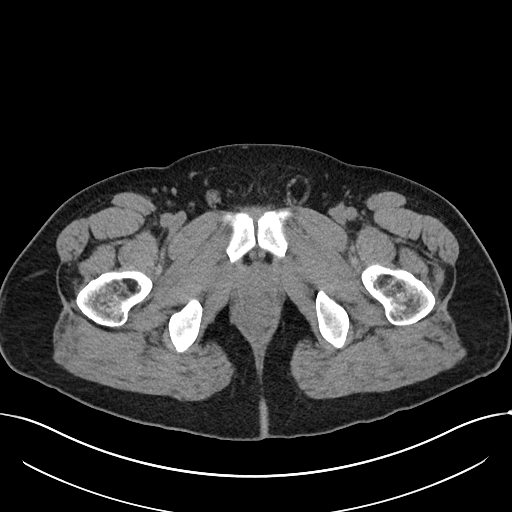
[im 19/99  soft-tissue]
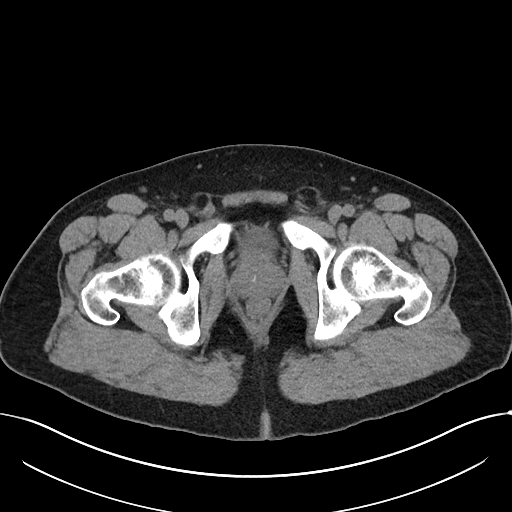
[im 29/99  soft-tissue]
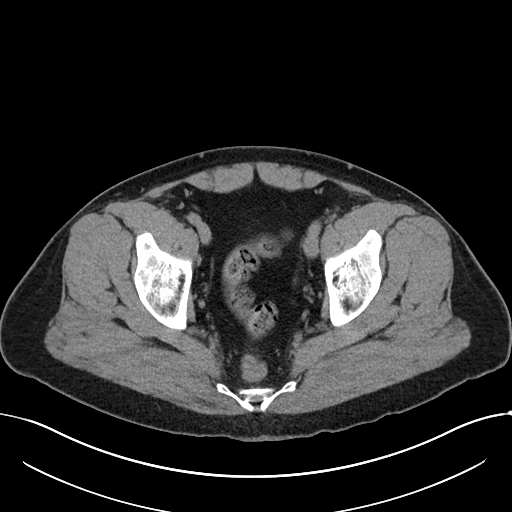
[im 33/99  soft-tissue]
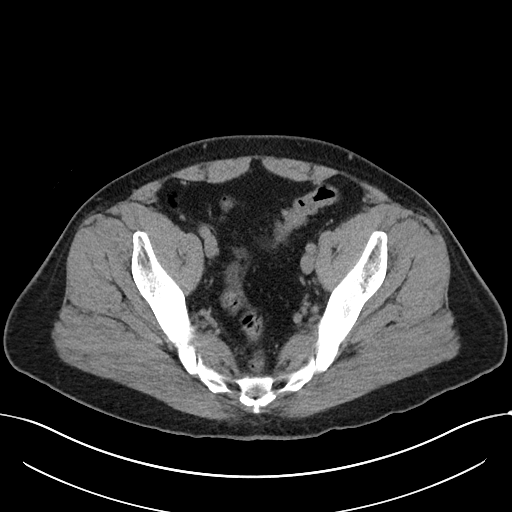
[im 38/99  soft-tissue]
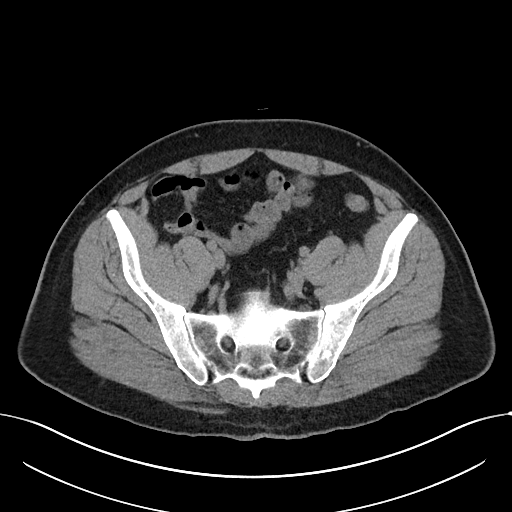
[im 47/99  soft-tissue]
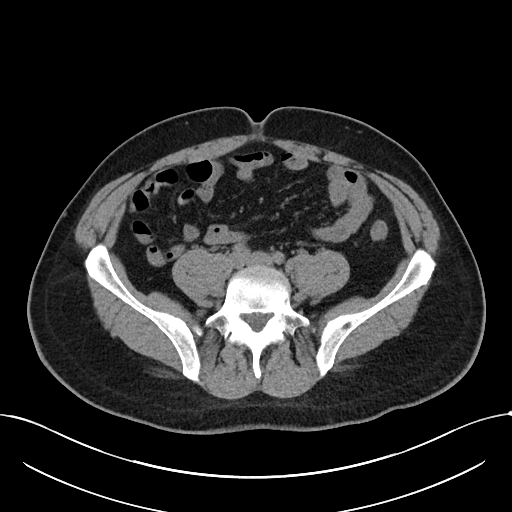
[im 52/99  soft-tissue]
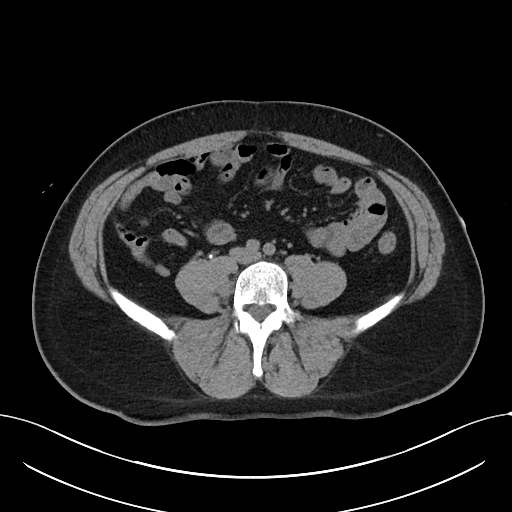
[im 61/99  soft-tissue]
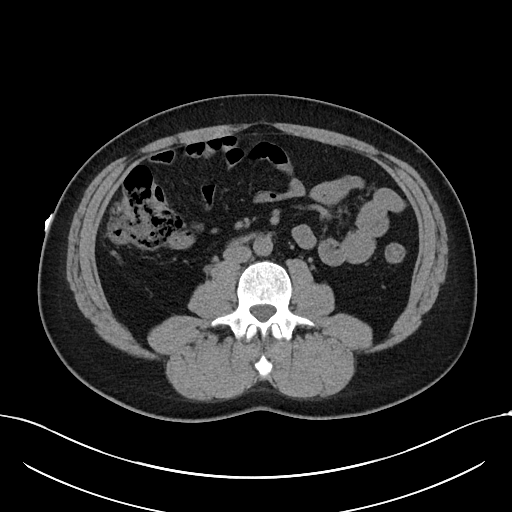
[im 61/99  bone]
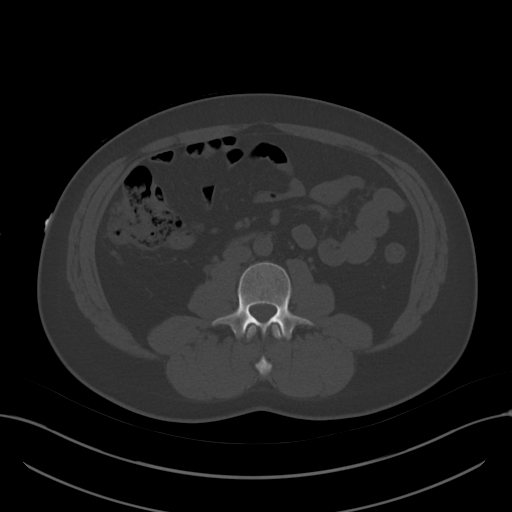
[im 66/99  soft-tissue]
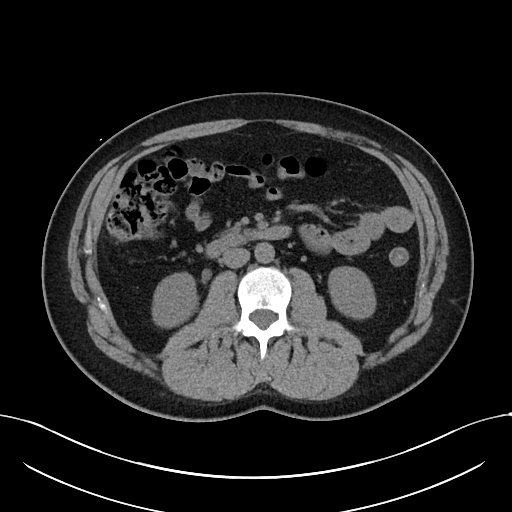
[im 75/99  soft-tissue]
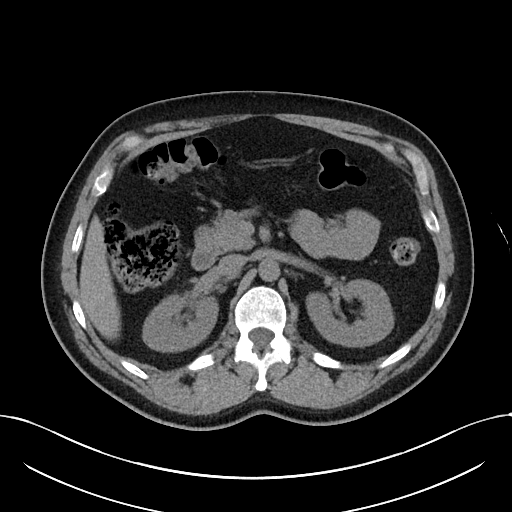
[im 80/99  soft-tissue]
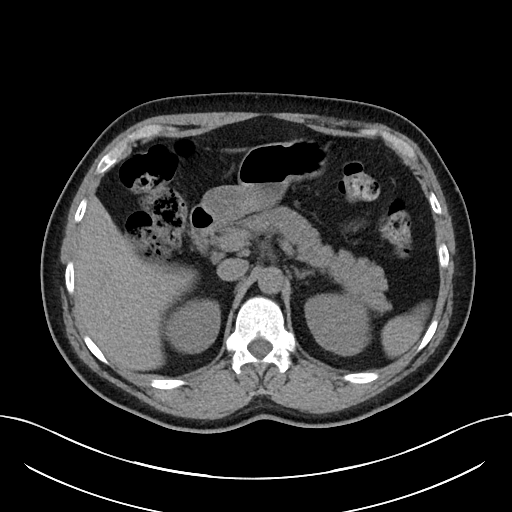
[im 85/99  soft-tissue]
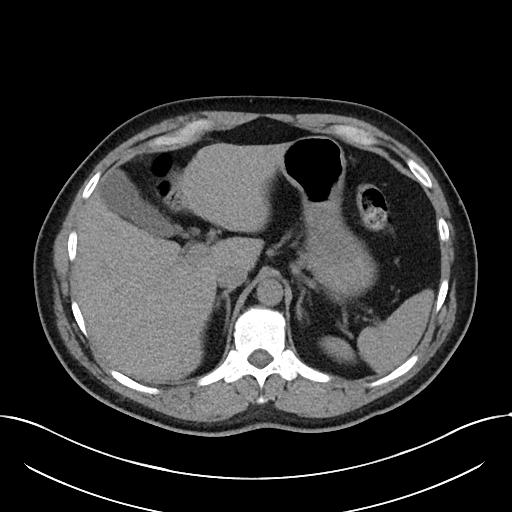
[im 94/99  soft-tissue]
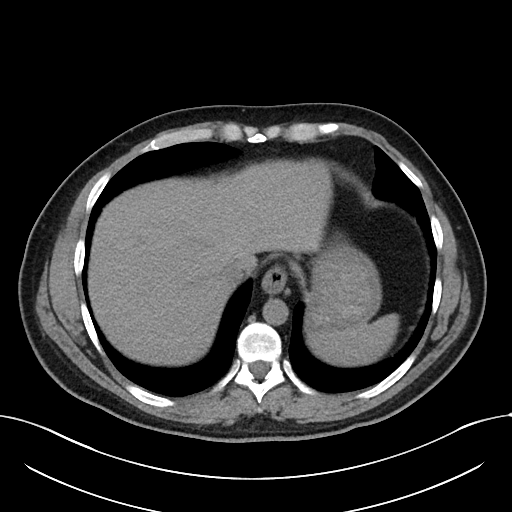

[Series 6: cor · coronal · 0.82mm/px · 3 of 101 slices shown]
[im 34/101  soft-tissue]
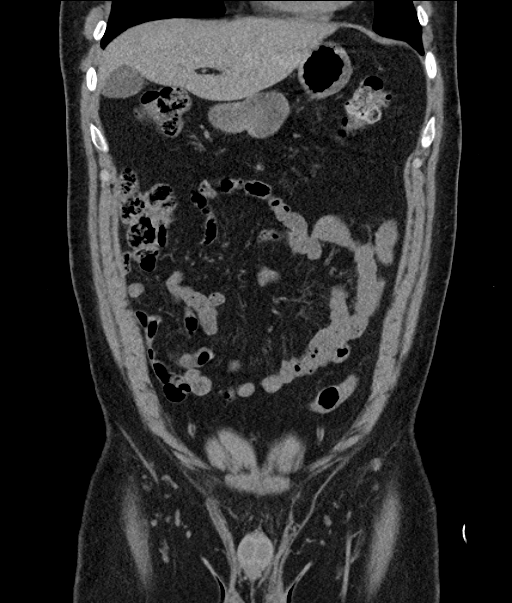
[im 45/101  soft-tissue]
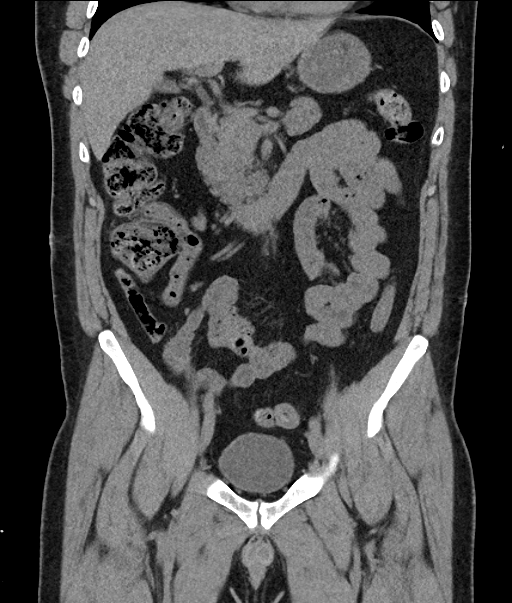
[im 56/101  soft-tissue]
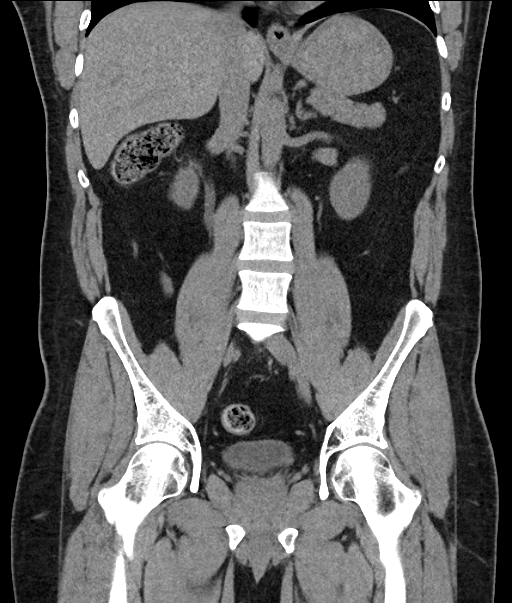

[17 of 46 positions shown; findings below may reference images not displayed]

FINDINGS: Lower chest: Lung bases are clear. No effusions. Heart is normal
size.

Hepatobiliary: No focal hepatic abnormality. Gallbladder
unremarkable.

Pancreas: No focal abnormality or ductal dilatation.

Spleen: No focal abnormality.  Normal size.

Adrenals/Urinary Tract: Mild right hydronephrosis due to a 3 mm mid
right ureteral stone. No additional renal or ureteral stones. No
hydronephrosis on the left. Adrenal glands and urinary bladder
unremarkable.

Stomach/Bowel: Normal appendix. Stomach, large and small bowel
grossly unremarkable.

Vascular/Lymphatic: No evidence of aneurysm or adenopathy.

Reproductive: No visible focal abnormality.

Other: No free fluid or free air. Small left inguinal hernia
containing fat.

Musculoskeletal: No acute bony abnormality.
IMPRESSION: 3 mm mid right ureteral stone.  Mild hydronephrosis.

## 2020-08-03 ENCOUNTER — Encounter: Payer: Self-pay | Admitting: Gastroenterology

## 2020-09-03 ENCOUNTER — Other Ambulatory Visit: Payer: Self-pay

## 2020-09-03 ENCOUNTER — Ambulatory Visit (AMBULATORY_SURGERY_CENTER): Payer: Self-pay | Admitting: *Deleted

## 2020-09-03 VITALS — Ht 72.0 in | Wt 204.0 lb

## 2020-09-03 DIAGNOSIS — Z1211 Encounter for screening for malignant neoplasm of colon: Secondary | ICD-10-CM

## 2020-09-03 MED ORDER — NA SULFATE-K SULFATE-MG SULF 17.5-3.13-1.6 GM/177ML PO SOLN: 1.0000 | Freq: Once | ORAL | 0 refills | Status: AC

## 2020-09-03 NOTE — Progress Notes (Addendum)
No egg or soy allergy known to patient  No issues with past sedation with any surgeries or procedures Patient denies ever being told they had issues or difficulty with intubation  No FH of Malignant Hyperthermia No diet pills per patient No home 02 use per patient  No blood thinners per patient  Pt denies issues with constipation  No A fib or A flutter  EMMI video to pt or via MyChart  COVID 19 guidelines implemented in [facility] today with Pt and RN  Pt is fully vaccinated  for Covid    Discussed with pt there will be an out-of-pocket cost for prep and that varies from $0 to 70 dollars   Due to the COVID-19 pandemic we are asking patients to follow certain guidelines.  Pt aware of COVID protocols and [facility] guidelines   

## 2020-09-23 ENCOUNTER — Encounter: Payer: 59 | Admitting: Gastroenterology

## 2020-09-23 ENCOUNTER — Encounter: Payer: Self-pay | Admitting: Gastroenterology

## 2020-09-23 ENCOUNTER — Other Ambulatory Visit: Payer: Self-pay

## 2020-09-23 ENCOUNTER — Ambulatory Visit (AMBULATORY_SURGERY_CENTER): Payer: 59 | Admitting: Gastroenterology

## 2020-09-23 VITALS — BP 94/59 | HR 64 | Temp 97.3°F | Resp 12 | Ht 72.0 in | Wt 204.0 lb

## 2020-09-23 DIAGNOSIS — D122 Benign neoplasm of ascending colon: Secondary | ICD-10-CM

## 2020-09-23 DIAGNOSIS — Z1211 Encounter for screening for malignant neoplasm of colon: Secondary | ICD-10-CM

## 2020-09-23 MED ORDER — SODIUM CHLORIDE 0.9 % IV SOLN: 500.0000 mL | Freq: Once | INTRAVENOUS | Status: DC

## 2020-09-23 NOTE — Progress Notes (Signed)
S.B. vital signs.

## 2020-09-23 NOTE — Progress Notes (Signed)
Report to PACU, RN, vss, BBS= Clear.  

## 2020-09-23 NOTE — Progress Notes (Signed)
Called to room to assist during endoscopic procedure.  Patient ID and intended procedure confirmed with present staff. Received instructions for my participation in the procedure from the performing physician.  

## 2020-09-23 NOTE — Patient Instructions (Signed)
YOU HAD AN ENDOSCOPIC PROCEDURE TODAY AT THE [facility]:   Refer to the procedure report that was given to you for any specific questions about what was found during the examination.  If the procedure report does not answer your questions, please call your gastroenterologist to clarify.  If you requested that your care partner not be given the details of your procedure findings, then the procedure report has been included in a sealed envelope for you to review at your convenience later.  YOU SHOULD EXPECT: Some feelings of bloating in the abdomen. Passage of more gas than usual.  Walking can help get rid of the air that was put into your GI tract during the procedure and reduce the bloating. If you had a lower endoscopy (such as a colonoscopy or flexible sigmoidoscopy) you may notice spotting of blood in your stool or on the toilet paper. If you underwent a bowel prep for your procedure, you may not have a normal bowel movement for a few days.  Please Note:  You might notice some irritation and congestion in your nose or some drainage.  This is from the oxygen used during your procedure.  There is no need for concern and it should clear up in a day or so.  SYMPTOMS TO REPORT IMMEDIATELY:   Following lower endoscopy (colonoscopy or flexible sigmoidoscopy):  Excessive amounts of blood in the stool  Significant tenderness or worsening of abdominal pains  Swelling of the abdomen that is new, acute  Fever of 100F or higher   Following upper endoscopy (EGD)  Vomiting of blood or coffee ground material  New chest pain or pain under the shoulder blades  Painful or persistently difficult swallowing  New shortness of breath  Fever of 100F or higher  Black, tarry-looking stools  For urgent or emergent issues, a gastroenterologist can be reached at any hour by calling [phone]. Do not use MyChart messaging for urgent concerns.    DIET:  We do recommend a small meal at first, but  then you may proceed to your regular diet.  Drink plenty of fluids but you should avoid alcoholic beverages for 24 hours.  ACTIVITY:  You should plan to take it easy for the rest of today and you should NOT DRIVE or use heavy machinery until tomorrow (because of the sedation medicines used during the test).    FOLLOW UP: Our staff will call the number listed on your records 48-72 hours following your procedure to check on you and address any questions or concerns that you may have regarding the information given to you following your procedure. If we do not reach you, we will leave a message.  We will attempt to reach you two times.  During this call, we will ask if you have developed any symptoms of COVID 19. If you develop any symptoms (ie: fever, flu-like symptoms, shortness of breath, cough etc.) before then, please call [phone].  If you test positive for Covid 19 in the 2 weeks post procedure, please call and report this information to us.    If any biopsies were taken you will be contacted by phone or by letter within the next 1-3 weeks.  Please call us at [phone] if you have not heard about the biopsies in 3 weeks.    SIGNATURES/CONFIDENTIALITY: You and/or your care partner have signed paperwork which will be entered into your electronic medical record.  These signatures attest to the fact that that the information above on   your After Visit Summary has been reviewed and is understood.  Full responsibility of the confidentiality of this discharge information lies with you and/or your care-partner. 

## 2020-09-23 NOTE — Progress Notes (Signed)
Pt's states no medical or surgical changes since previsit or office visit. 

## 2020-09-23 NOTE — Op Note (Signed)
[place] Patient Name: Gregory [name] Procedure Date: 09/23/2020 3:34 PM MRN: [id number] Endoscopist: Readlyn Loletha Carrow , MD Age: 50 Referring MD:  Date of Birth: Oct 26, 1970 Gender: Male Account #: [id number] Procedure:                Colonoscopy Indications:              Screening for colorectal malignant neoplasm, This                            is the patient's first colonoscopy Medicines:                Monitored Anesthesia Care Procedure:                Pre-Anesthesia Assessment:                           - Prior to the procedure, a History and Physical                            was performed, and patient medications and                            allergies were reviewed. The patient's tolerance of                            previous anesthesia was also reviewed. The risks                            and benefits of the procedure and the sedation                            options and risks were discussed with the patient.                            All questions were answered, and informed consent                            was obtained. Prior Anticoagulants: The patient has                            taken no previous anticoagulant or antiplatelet                            agents. ASA Grade Assessment: II - A patient with                            mild systemic disease. After reviewing the risks                            and benefits, the patient was deemed in                            satisfactory condition to undergo the procedure.  After obtaining informed consent, the colonoscope                            was passed under direct vision. Throughout the                            procedure, the patient's blood pressure, pulse, and                            oxygen saturations were monitored continuously. The                            Olympus CF-HQ190L [id number]) Colonoscope was                            introduced through the anus and  advanced to the the                            cecum, identified by appendiceal orifice and                            ileocecal valve. The colonoscopy was performed                            without difficulty. The patient tolerated the                            procedure well. The quality of the bowel                            preparation was good. The ileocecal valve,                            appendiceal orifice, and rectum were photographed. Scope In: 4:18:36 PM Scope Out: 4:30:52 PM Scope Withdrawal Time: 0 hours 9 minutes 31 seconds  Total Procedure Duration: 0 hours 12 minutes 16 seconds  Findings:                 The perianal and digital rectal examinations were                            normal.                           A diminutive polyp was found in the ascending                            colon. The polyp was sessile. The polyp was removed                            with a cold snare. Resection and retrieval were                            complete.  The exam was otherwise without abnormality on                            direct and retroflexion views. Complications:            No immediate complications. Estimated Blood Loss:     Estimated blood loss was minimal. Impression:               - One diminutive polyp in the ascending colon,                            removed with a cold snare. Resected and retrieved.                           - The examination was otherwise normal on direct                            and retroflexion views. Recommendation:           - Patient has a contact number available for                            emergencies. The signs and symptoms of potential                            delayed complications were discussed with the                            patient. Return to normal activities tomorrow.                            Written discharge instructions were provided to the                            patient.                            - Resume previous diet.                           - Continue present medications.                           - Await pathology results.                           - Repeat colonoscopy is recommended for                            surveillance. The colonoscopy date will be                            determined after pathology results from today's                            exam become available for review. Kierra Jezewski L. Loletha Carrow, MD 09/23/2020 4:34:37 PM This report has been  signed electronically.

## 2020-09-25 ENCOUNTER — Telehealth: Payer: Self-pay | Admitting: *Deleted

## 2020-09-25 NOTE — Telephone Encounter (Signed)
Have you developed a fever since your procedure? no  2.   Have you had an respiratory symptoms (SOB or cough) since your procedure? no  3.   Have you tested positive for COVID 19 since your procedure no  4.   Have you had any family members/close contacts diagnosed with the COVID 19 since your procedure?  no   If yes to any of these questions please route to Joylene John, RN and Joella Prince, RN  Follow up Call-  Call back number 09/23/2020  Post procedure Call Back phone  # [phone]  Permission to leave phone message Yes  Some recent data might be hidden     Patient questions:  Do you have a fever, pain , or abdominal swelling? No. Pain Score  0 *  Have you tolerated food without any problems? Yes.    Have you been able to return to your normal activities? Yes.    Do you have any questions about your discharge instructions: Diet   No. Medications  No. Follow up visit  No.  Do you have questions or concerns about your Care? No.  Actions: * If pain score is 4 or above: No action needed, pain <4.

## 2020-09-28 ENCOUNTER — Encounter: Payer: Self-pay | Admitting: Gastroenterology

## 2020-12-09 ENCOUNTER — Ambulatory Visit (HOSPITAL_BASED_OUTPATIENT_CLINIC_OR_DEPARTMENT_OTHER): Payer: 59 | Admitting: Internal Medicine

## 2020-12-09 ENCOUNTER — Encounter (HOSPITAL_BASED_OUTPATIENT_CLINIC_OR_DEPARTMENT_OTHER): Payer: Self-pay | Admitting: Internal Medicine

## 2020-12-09 ENCOUNTER — Other Ambulatory Visit: Payer: Self-pay

## 2020-12-09 VITALS — BP 114/68 | HR 79 | Ht 72.0 in | Wt 207.0 lb

## 2020-12-09 DIAGNOSIS — E785 Hyperlipidemia, unspecified: Secondary | ICD-10-CM | POA: Diagnosis not present

## 2020-12-09 DIAGNOSIS — Z789 Other specified health status: Secondary | ICD-10-CM

## 2020-12-09 NOTE — Progress Notes (Signed)
LIPID CLINIC CONSULT NOTE  Chief Complaint:  Manage dyslipidemia  Primary Care Physician: Mayra Neer, MD  Primary Cardiologist:  None  HPI:  Gregory [name] is a 50 y.o. male who is being seen today for the evaluation of dyslipidemia at the request of Mayra Neer, MD. This is a pleasant 50 year old male who works for the city of [place], who presents today for evaluation management of dyslipidemia.  He reports a family history of dyslipidemia and coronary artery disease in several relatives but not necessarily first-degree relatives.  His father has atrial fibrillation and hypertension.  Past medical history significant for rheumatoid arthritis followed by Dr. Trudie Reed.  He is on Enbrel for this.  He also has sleep apnea, low testosterone and dyslipidemia.  He was referred by his PCP for elevated cholesterol.  Recent labs in April 2022 showed total cholesterol 238, HDL 35, LDL 130 and triglycerides 407.  This was an unusual study and that many prior cholesterol test that showed much better cholesterol control including no evidence of triglycerides over 200.  He reports around the time that this lab work was taken and he had come off of ezetimibe because he was having some side effects namely changes in bowel pattern including constipation and was having rectal bleeding.  He did undergo colonoscopy and work-up for this without a clear source of his symptoms and it was attributed to the ezetimibe.  He also had he been giving a dose of testosterone in fact his testosterone level was quite high at about 1400 (upper limit of normal 1000).  Despite being on testosterone replacement for a history of low T, he reports no significant improvement in symptoms of fatigue.  He has subsequently decided to discontinue this.  He previously had been evaluated by Dr. Marlou Porch in 2017 for chest pain, but at this point is considered a new patient given the remote visit.  At that time he underwent a stress test  and an echocardiogram, no significant findings were noted.  He was thought to have a familial hyperlipidemia with LDL cholesterol about 240 at the time but if you review past medical records, his LDL has vacillated significantly between about 240 and 130, which could be related to therapy or perhaps a dietary component.  No recent LDL cholesterols have been quite that high.  The major issue now is high triglycerides.  Unfortunately, he has had both statin and ezetimibe intolerance.  Statins apparently caused symptoms of palpitations and he has been on a number of different statins including statin, rosuvastatin and simvastatin.  I personally believe it may have been the testosterone that caused his triglycerides despite, this is a very well-known phenomenon.  If he plans to remain off of that which he says he will because he does not think it was effective for him, then it will allow other options.  PMHx:  Past Medical History:  Diagnosis Date   Arthritis    BPH (benign prostatic hyperplasia)    Deviated septum    Dyspnea    GERD (gastroesophageal reflux disease)    Hypercholesterolemia    Osteopenia    Rheumatoid arthritis(714.0)    Sleep apnea    Tibia and fibula open fracture, right     Past Surgical History:  Procedure Laterality Date   KNEE ARTHROSCOPY W/ MENISCECTOMY Right    WRIST ARTHROSCOPY Left 2004    FAMHx:  Family History  Problem Relation Age of Onset   Hypertension Father    Atrial fibrillation Father  Hyperlipidemia Mother    Heart attack Paternal Grandfather    CAD Cousin    Heart disease Paternal Aunt    Colon cancer Neg Hx    Colon polyps Neg Hx    Esophageal cancer Neg Hx    Stomach cancer Neg Hx    Rectal cancer Neg Hx     SOCHx:   reports that he has never smoked. He has never used smokeless tobacco. He reports that he does not drink alcohol and does not use drugs.  ALLERGIES:  Allergies  Allergen Reactions   Amoxicillin Rash   Pravastatin  Palpitations   Rosuvastatin Palpitations   Simvastatin Palpitations    ROS: Pertinent items noted in HPI and remainder of comprehensive ROS otherwise negative.  HOME MEDS: Current Outpatient Medications on File Prior to Visit  Medication Sig Dispense Refill   Calcium Carbonate-Vitamin D 600-400 MG-UNIT tablet Take 1 tablet by mouth daily.     etanercept (ENBREL) 50 MG/ML injection Inject 50 mg into the skin every Thursday.      fluvastatin XL (LESCOL XL) 80 MG 24 hr tablet Take 80 mg by mouth daily.     folic acid (FOLVITE) 1 MG tablet Take 3 mg by mouth daily. Patient takes 2 tablets daily and 3 tablets on Thursday and Friday     Methotrexate Sodium (METHOTREXATE, PF,) 50 MG/2ML injection Inject 25 mg into the muscle every Thursday.     Multiple Vitamins-Minerals (MULTIVITAMIN & MINERAL PO) Take 1 tablet by mouth daily.     Omega-3 Fatty Acids (FISH OIL PO) Take 1 capsule by mouth daily.     Pitavastatin Calcium 1 MG TABS Take 1 mg by mouth daily.     HYDROcodone-acetaminophen (NORCO) 5-325 MG tablet Take 1 tablet by mouth every 4 (four) hours as needed for severe pain. (Patient not taking: Reported on 12/09/2020) 15 tablet 0   ibuprofen (ADVIL) 100 MG tablet Take 100 mg by mouth every 6 (six) hours as needed for fever. (Patient not taking: Reported on 12/09/2020)     VOLTAREN 1 % GEL Apply 2 g topically 4 (four) times daily as needed (for pain).  (Patient not taking: Reported on 12/09/2020)     No current facility-administered medications on file prior to visit.    LABS/IMAGING: No results found for this or any previous visit (from the past 48 hour(s)). No results found.  LIPID PANEL: No results found for: CHOL, TRIG, HDL, CHOLHDL, VLDL, LDLCALC, LDLDIRECT  WEIGHTS: Wt Readings from Last 3 Encounters:  12/09/20 207 lb (93.9 kg)  09/23/20 204 lb (92.5 kg)  09/03/20 204 lb (92.5 kg)    VITALS: BP 114/68   Pulse 79   Ht 6' (1.829 m)   Wt 207 lb (93.9 kg)   SpO2 97%   BMI  28.07 kg/m   EXAM: General appearance: alert and no distress Neck: no carotid bruit, no JVD, and thyroid not enlarged, symmetric, no tenderness/mass/nodules Lungs: clear to auscultation bilaterally Heart: regular rate and rhythm, S1, S2 normal, no murmur, click, rub or gallop Abdomen: soft, non-tender; bowel sounds normal; no masses,  no organomegaly Extremities: extremities normal, atraumatic, no cyanosis or edema Pulses: 2+ and symmetric Skin: Skin color, texture, turgor normal. No rashes or lesions Neurologic: Grossly normal Psych: Pleasant  EKG: N/A  ASSESSMENT: Mixed dyslipidemia Statin and ezetimibe intolerant Low testosterone  PLAN: 1.   Mr. Krell has a mixed dyslipidemia but has been intolerant to statins and ezetimibe.  He also has low testosterone and for  short while was on testosterone replacement which did not improve his symptoms however I would argue that this may have caused a spike in his triglycerides.  This is around the time that he came off of ezetimibe due to potential side effects related to changes in bowel habits and GI bleeding.  I think would be very helpful to restratification further by recommending a coronary calcium score.  We then could consider treatment options besides statin therapy.  Plan follow-up with me after calcium score and will now repeat lipid NMR and APO B level.  Thanks again for the kind referral.  Pixie Casino, MD, FACC, [place] Director of the [facility] Diplomate of the American Board of Clinical Lipidology Attending Cardiologist  Direct Dial: [phone]  Fax: [phone]  Website:  [email or web address] 12/09/2020, 3:20 PM

## 2020-12-09 NOTE — Patient Instructions (Signed)
Medication Instructions:  NO CHANGE  Lab Work: FASTING lab work to check cholesterol  -- NMR lipoprofile, apolipoprotein B  If you have labs (blood work) drawn today and your tests are completely normal, you will receive your results only by: [facility] (if you have MyChart) OR A paper copy in the mail If you have any lab test that is abnormal or we need to change your treatment, we will call you to review the results.   Testing/Procedures: Dr. Debara Pickett has ordered a CT coronary calcium score. This test is done at [address] [facility]. This is $99 out of pocket.   Coronary CalciumScan A coronary calcium scan is an imaging test used to look for deposits of calcium and other fatty materials (plaques) in the inner lining of the blood vessels of the heart (coronary arteries). These deposits of calcium and plaques can partly clog and narrow the coronary arteries without producing any symptoms or warning signs. This puts a person at risk for a heart attack. This test can detect these deposits before symptoms develop. Tell a health care provider about: Any allergies you have. All medicines you are taking, including vitamins, herbs, eye drops, creams, and over-the-counter medicines. Any problems you or family members have had with anesthetic medicines. Any blood disorders you have. Any surgeries you have had. Any medical conditions you have. Whether you are pregnant or may be pregnant. What are the risks? Generally, this is a safe procedure. However, problems may occur, including: Harm to a pregnant woman and her unborn baby. This test involves the use of radiation. Radiation exposure can be dangerous to a pregnant woman and her unborn baby. If you are pregnant, you generally should not have this procedure done. Slight increase in the risk of cancer. This is because of the radiation involved in the test. What happens before the procedure? No preparation is needed for this  procedure. What happens during the procedure? You will undress and remove any jewelry around your neck or chest. You will put on a hospital gown. Sticky electrodes will be placed on your chest. The electrodes will be connected to an electrocardiogram (ECG) machine to record a tracing of the electrical activity of your heart. A CT scanner will take pictures of your heart. During this time, you will be asked to lie still and hold your breath for 2-3 seconds while a picture of your heart is being taken. The procedure may vary among health care providers and hospitals. What happens after the procedure? You can get dressed. You can return to your normal activities. It is up to you to get the results of your test. Ask your health care provider, or the department that is doing the test, when your results will be ready. Summary A coronary calcium scan is an imaging test used to look for deposits of calcium and other fatty materials (plaques) in the inner lining of the blood vessels of the heart (coronary arteries). Generally, this is a safe procedure. Tell your health care provider if you are pregnant or may be pregnant. No preparation is needed for this procedure. A CT scanner will take pictures of your heart. You can return to your normal activities after the scan is done. This information is not intended to replace advice given to you by your health care provider. Make sure you discuss any questions you have with your health care provider. Document Released: 09/24/2007 Document Revised: 02/15/2016 Document Reviewed: 02/15/2016 Elsevier Interactive Patient Education  2017 Elsevier  Inc.    Follow-Up: At [facility], you and your health needs are our priority.  As part of our continuing mission to provide you with exceptional heart care, we have created designated Provider Care Teams.  These Care Teams include your primary Cardiologist (physician) and Advanced Practice Providers (APPs -   Physician Assistants and Nurse Practitioners) who all work together to provide you with the care you need, when you need it.  We recommend signing up for the patient portal called "MyChart".  Sign up information is provided on this After Visit Summary.  MyChart is used to connect with patients for Virtual Visits (Telemedicine).  Patients are able to view lab/test results, encounter notes, upcoming appointments, etc.  Non-urgent messages can be sent to your provider as well.   To learn more about what you can do with MyChart, go to [email or web address].    Your next appointment:   4 month(s) - lipid clinic  The format for your next appointment:   In Person  Provider:   K. Mali Hilty, MD   Other Instructions

## 2020-12-28 ENCOUNTER — Ambulatory Visit (INDEPENDENT_AMBULATORY_CARE_PROVIDER_SITE_OTHER)
Admission: RE | Admit: 2020-12-28 | Discharge: 2020-12-28 | Disposition: A | Discharge status: Home / Self Care | Payer: Self-pay | Source: Ambulatory Visit | Attending: Internal Medicine | Admitting: Internal Medicine

## 2020-12-28 ENCOUNTER — Other Ambulatory Visit: Payer: Self-pay

## 2020-12-28 DIAGNOSIS — E785 Hyperlipidemia, unspecified: Secondary | ICD-10-CM

## 2020-12-30 LAB — NMR, LIPOPROFILE
Cholesterol, Total: 274 mg/dL — ABNORMAL HIGH (ref 100–199)
HDL Particle Number: 33.5 umol/L (ref >=30.5)
HDL-C: 42 mg/dL (ref >39)
LDL Particle Number: 2485 nmol/L — ABNORMAL HIGH (ref <1000)
LDL Size: 20 nm — ABNORMAL LOW (ref >20.5)
LDL-C (NIH Calc): 184 mg/dL — ABNORMAL HIGH (ref 0–99)
LP-IR Score: 82 — ABNORMAL HIGH (ref <=45)
Small LDL Particle Number: 1664 nmol/L — ABNORMAL HIGH (ref <=527)
Triglycerides: 248 mg/dL — ABNORMAL HIGH (ref 0–149)

## 2020-12-30 LAB — APOLIPOPROTEIN B: Apolipoprotein B: 151 mg/dL — ABNORMAL HIGH (ref <90)

## 2020-12-31 NOTE — Progress Notes (Signed)
He is at increased risk for heart attack down the road if we cannot get his cholesterol lower. Since he cannot take statins or zetia, this is the next best thing - Nexletol has limited data and is not nearly as potent as a PCSK9i. I'm worried about him and want him to get the best treatment we have.  Dr. Lemmie Evens

## 2020-12-31 NOTE — Telephone Encounter (Signed)
Would advise PCSK9i - best option. Nexletol has little data and is not nearly as effective, since he cannot take statins or zetia - he is at significant risk for heart attack in the next 5-10 years unless we can lower his cholesterol.  Dr. Lemmie Evens

## 2021-05-18 ENCOUNTER — Ambulatory Visit (HOSPITAL_BASED_OUTPATIENT_CLINIC_OR_DEPARTMENT_OTHER): Payer: 59 | Admitting: Internal Medicine

## 2023-05-19 ENCOUNTER — Ambulatory Visit (INDEPENDENT_AMBULATORY_CARE_PROVIDER_SITE_OTHER): Payer: 59 | Admitting: Otolaryngology

## 2023-05-19 ENCOUNTER — Encounter (INDEPENDENT_AMBULATORY_CARE_PROVIDER_SITE_OTHER): Payer: Self-pay | Admitting: Otolaryngology

## 2023-05-19 VITALS — BP 123/80 | HR 79 | Ht 72.0 in | Wt 214.0 lb

## 2023-05-19 DIAGNOSIS — R0981 Nasal congestion: Secondary | ICD-10-CM | POA: Diagnosis not present

## 2023-05-19 DIAGNOSIS — Z6829 Body mass index (BMI) 29.0-29.9, adult: Secondary | ICD-10-CM

## 2023-05-19 DIAGNOSIS — G4733 Obstructive sleep apnea (adult) (pediatric): Secondary | ICD-10-CM | POA: Diagnosis not present

## 2023-05-19 DIAGNOSIS — Z789 Other specified health status: Secondary | ICD-10-CM

## 2023-05-19 DIAGNOSIS — J343 Hypertrophy of nasal turbinates: Secondary | ICD-10-CM | POA: Diagnosis not present

## 2023-05-19 DIAGNOSIS — R065 Mouth breathing: Secondary | ICD-10-CM

## 2023-05-19 DIAGNOSIS — J3089 Other allergic rhinitis: Secondary | ICD-10-CM

## 2023-05-19 DIAGNOSIS — Z91198 Patient's noncompliance with other medical treatment and regimen for other reason: Secondary | ICD-10-CM

## 2023-05-19 DIAGNOSIS — J342 Deviated nasal septum: Secondary | ICD-10-CM | POA: Diagnosis not present

## 2023-05-19 MED ORDER — MOMETASONE FUROATE 50 MCG/ACT NA SUSP: 2.0000 | Freq: Every day | NASAL | 12 refills | Status: DC

## 2023-05-19 MED ORDER — SALINE SPRAY 0.65 % NA SOLN: 1.0000 | NASAL | 0 refills | Status: AC | PRN

## 2023-05-19 MED ORDER — CETIRIZINE HCL 10 MG PO TABS: 10.0000 mg | ORAL_TABLET | Freq: Every day | ORAL | 11 refills | Status: DC

## 2023-05-19 NOTE — H&P (View-Only) (Signed)
 ENT CONSULT:  Reason for Consult: OSA CPAP intolerance  Referring provider: Dr Harl Ee Sleep     HPI: Discussed the use of AI scribe software for clinical note transcription with the patient, who gave verbal consent to proceed.  History of Present Illness   Gregory [name] is a 53 year old male with hx of sleep apnea who presents with CPAP intolerance and nasal breathing issues. He was referred by Dr. Harl for evaluation of sleep apnea and potential Inspire Implant.  He has a history of sleep apnea diagnosed in 2019 and has been intolerant of CPAP therapy. He attempted using a full face mask for six to nine months but experienced claustrophobia, waking up with the mask off, and difficulty returning to sleep due to air pressure. He discontinued CPAP use in 2019.  He is a mouth breather with a history of chronic nasal congestion and a possible deviated septum. He has worsening of baseline nasal congestion when lying down and occasionally wakes up with one side completely closed. Nasal sprays like Flonase have been ineffective. No prior nasal or sinus procedures.   He has rheumatoid arthritis, diagnosed around 2001, and has been on methotrexate with some nausea as a side effect. His condition has been controlled since 2005-2006. He reports swelling and pain in his hands, wrists, knees, and ankles, but no significant issues currently. Had poor healing in the past when on Enbrel, and stopped it due to poor healing after a leg injury. Currently on methotrexate.   No personal history of heart disease or stroke, although his father had a stroke last year. He uses melatonin almost daily to aid sleep and has no diagnosis of insomnia.      Records Reviewed:  [facility] Sleep Office Visit Dr Harl 01/02/23 and 11/30/22 52 yoM hx of RA, GERD, BPH, OSA, AHI of 32 on HST 07/2017.  Sleep schedule 10-10:30 pm to 6:30 am. Wakes 1-3 times a night. Interested in [place]. Had a trial of CPAP in 2019 and did  not tolerate it.     Past Medical History:  Diagnosis Date   Arthritis    BPH (benign prostatic hyperplasia)    Deviated septum    Dyspnea    GERD (gastroesophageal reflux disease)    Hypercholesterolemia    Osteopenia    Rheumatoid arthritis(714.0)    Sleep apnea    Tibia and fibula open fracture, right     Past Surgical History:  Procedure Laterality Date   KNEE ARTHROSCOPY W/ MENISCECTOMY Right    WRIST ARTHROSCOPY Left 2004    Family History  Problem Relation Age of Onset   Hypertension Father    Atrial fibrillation Father    Hyperlipidemia Mother    Heart attack Paternal Grandfather    CAD Cousin    Heart disease Paternal Aunt    Colon cancer Neg Hx    Colon polyps Neg Hx    Esophageal cancer Neg Hx    Stomach cancer Neg Hx    Rectal cancer Neg Hx     Social History:  reports that he has never smoked. He has never used smokeless tobacco. He reports that he does not drink alcohol and does not use drugs.  Allergies:  Allergies  Allergen Reactions   Amoxicillin Rash   Pravastatin Palpitations   Rosuvastatin Palpitations   Simvastatin Palpitations    Medications: I have reviewed the patient's current medications.  The PMH, PSH, Medications, Allergies, and SH were reviewed and updated.  ROS: Constitutional: Negative  for fever, weight loss and weight gain. Cardiovascular: Negative for chest pain and dyspnea on exertion. Respiratory: Is not experiencing shortness of breath at rest. Gastrointestinal: Negative for nausea and vomiting. Neurological: Negative for headaches. Psychiatric: The patient is not nervous/anxious  Blood pressure 123/80, pulse 79, height 6' (1.829 m), weight 214 lb (97.1 kg), SpO2 95%.  PHYSICAL EXAM:  Exam: General: Well-developed, well-nourished Communication and Voice: Clear pitch and clarity Respiratory Respiratory effort: Equal inspiration and expiration without stridor Cardiovascular Peripheral Vascular: Warm extremities  with equal color/perfusion Eyes: No nystagmus with equal extraocular motion bilaterally Neuro/Psych/Balance: Patient oriented to person, place, and time; Appropriate mood and affect; Gait is intact with no imbalance; Cranial nerves I-XII are intact Head and Face Inspection: Normocephalic and atraumatic without mass or lesion Palpation: Facial skeleton intact without bony stepoffs Salivary Glands: No mass or tenderness Facial Strength: Facial motility symmetric and full bilaterally ENT Pinna: External ear intact and fully developed External canal: Canal is patent with intact skin Tympanic Membrane: Clear and mobile External Nose: No scar or anatomic deformity Internal Nose: Septum is deviated to the left with caudal spur on the right and significant narrowing of bilateral nasal passages (S- shaped septum). No polyp, or purulence. Mucosal edema and erythema present.  Bilateral inferior turbinate hypertrophy.  Lips, Teeth, and gums: Mucosa and teeth intact and viable TMJ: No pain to palpation with full mobility Oral cavity/oropharynx: No erythema or exudate, no lesions present 2+ tonsils Friedman 3-4 tongue position  Nasopharynx: No mass or lesion with intact mucosa Hypopharynx: Intact mucosa without pooling of secretions Larynx Glottic: Full true vocal cord mobility without lesion or mass Supraglottic: Normal appearing epiglottis and AE folds Interarytenoid Space: Moderate pachydermia&edema Subglottic Space: Patent without lesion or edema Neck Neck and Trachea: Midline trachea without mass or lesion Thyroid: No mass or nodularity Lymphatics: No lymphadenopathy  Procedure:    PROCEDURE NOTE: nasal endoscopy  Preoperative diagnosis: chronic nasal congestion and mouth breathing symptoms  Postoperative diagnosis: same  Procedure: Diagnostic nasal endoscopy (68768)  Surgeon: Elena Larry, M.D.  Anesthesia: Topical lidocaine  and Afrin  H&P REVIEW: The patient's history and  physical were reviewed today prior to procedure. All medications were reviewed and updated as well. Complications: None Condition is stable throughout exam Indications and consent: The patient presents with symptoms of chronic sinusitis not responding to previous therapies. All the risks, benefits, and potential complications were reviewed with the patient preoperatively and informed consent was obtained. The time out was completed with confirmation of the correct procedure.   Procedure: The patient was seated upright in the clinic. Topical lidocaine  and Afrin were applied to the nasal cavity. After adequate anesthesia had occurred, the rigid nasal endoscope was passed into the nasal cavity. The nasal mucosa, turbinates, septum, and sinus drainage pathways were visualized bilaterally. This revealed no purulence or significant secretions that might be cultured. There were no polyps or sites of significant inflammation. The mucosa was intact and there was no crusting present. The scope was then slowly withdrawn and the patient tolerated the procedure well. There were no complications or blood loss.    Studies Reviewed: NPSG in-lab sleep study 02/14/23 BMI 29 AHI of 52.8 (24 obstructive, 4 mixed and 6 central events and 330 hypopneas) ~ 2.7 % of central/mixed events    CT chest 2013 IMPRESSION:   1. No evidence of interstitial lung disease.  No findings to  explain the patient's shortness of breath.  2.  Tiny left renal stone.   Assessment/Plan: Encounter  Diagnoses  Name Primary?   Intolerance of continuous positive airway pressure (CPAP) ventilation Yes   OSA (obstructive sleep apnea)    BMI 29.0-29.9,adult    Deviated nasal septum    Chronic nasal congestion    Mouth breathing    Hypertrophy of both inferior nasal turbinates    Environmental and seasonal allergies     Assessment and Plan    Obstructive Sleep Apnea Diagnosed in 2019 with initial HST AHI of 30, but more recent in-lab  sleep study 02/2023 with AHI of 52.8. CPAP intolerance due to claustrophobia and discomfort. Considering Inspire implant. Discussed procedure, including device placement, drug-induced sleep endoscopy to rule out complete concentric collapse of the soft palate, and potential risks (infection, seroma, hematoma, nerve praxia, lung injury) and benefits (improved sleep apnea management). Advised avoiding strenuous activities for 3-4 weeks post-procedure.  OSA, moderate to severe, without multilevel collapse, with failure to tolerate PAP therapy and/or more conservative measures. Presence of smaller/absent tonsils (2+) and larger tongue position (Friedman 3-4 tongue position of III suggests that hypopharyngeal/retrolingual collapse is contributing to the patient's OSA. Janeth, M et al. Staging of obstructive sleep apnea/hypopnea syndrome: a guide to appropriate treatment. Laryngoscope, 2004 Mar, 114(3):454-9. PMID: [id number]) Options including positional therapy, weight loss, oral appliances will likely be ineffective due to severity of OSA. PAP therapy and surgical correction discussed. Pt is not an ideal candidate for oral appliance due to severity of OSA Pt could be a candidate for Hypoglossal nerve stimulation (Inspire therapy) pending DISE results  - Order drug-induced sleep endoscopy  Chronic Nasal Congestion Septal Deviation and Inferior turbinate hypertrophy Chronic nasal congestion with deviated septum/ITH and significant narrowing of nasal passages on nasal endoscopy today. Symptoms include mouth breathing and nasal obstruction, especially when lying down. Previous nasal sprays (Flonase) ineffective. Septal deviation confirmed. Discussed nasal sprays and surgical options. - Prescribed Nasonex  2 puffs b/l nares + Ocean spray to avoid nasal dryness  - if fails medical management will consider Septo/ITR in the future   Environmental Allergies  - Prescribed Nasonex  2 puffs b/l nares + Ocean spray to  avoid nasal dryness - Zyrtec  10 mg daily at bedtime  - will consider allergy  testing in the future   Rheumatoid Arthritis Diagnosed in 2001, well-controlled with methotrexate. Reports occasional nausea. No recent exacerbations. Discussed potential impact of methotrexate and Enbrel on wound healing and need to consult rheumatologist before surgery. - Continue methotrexate as prescribed - Consult rheumatologist regarding potential need to stop methotrexate and Enbrel before Inspire implant surgery  General Health Maintenance No history of heart disease or stroke. No current use of aspirin or blood thinners. Regular use of melatonin for sleep. - Continue melatonin as needed for sleep  Follow-up - Follow-up with surgery scheduling team for drug-induced sleep endoscopy - Discuss nasal congestion findings and potential septoplasty after endoscopy.      Thank you for allowing me to participate in the care of this patient. Please do not hesitate to contact me with any questions or concerns.   Elena Larry, MD Otolaryngology [facility] Phone: [phone] Fax: [phone]    05/19/2023, 5:22 PM

## 2023-05-19 NOTE — Progress Notes (Signed)
 ENT CONSULT:  Reason for Consult: OSA CPAP intolerance  Referring provider: Dr Dwyane Luo Sleep     HPI: Discussed the use of AI scribe software for clinical note transcription with the patient, who gave verbal consent to proceed.  History of Present Illness   Gregory [name] is a 53 year old male with hx of sleep apnea who presents with CPAP intolerance and nasal breathing issues. He was referred by Dr. Ermalinda Memos for evaluation of sleep apnea and potential Inspire Implant.  He has a history of sleep apnea diagnosed in 2019 and has been intolerant of CPAP therapy. He attempted using a full face mask for six to nine months but experienced claustrophobia, waking up with the mask off, and difficulty returning to sleep due to air pressure. He discontinued CPAP use in 2019.  He is a mouth breather with a history of chronic nasal congestion and a possible deviated septum. He has worsening of baseline nasal congestion when lying down and occasionally wakes up with one side completely closed. Nasal sprays like Flonase have been ineffective. No prior nasal or sinus procedures.   He has rheumatoid arthritis, diagnosed around 2001, and has been on methotrexate with some nausea as a side effect. His condition has been controlled since 2005-2006. He reports swelling and pain in his hands, wrists, knees, and ankles, but no significant issues currently. Had poor healing in the past when on Enbrel, and stopped it due to poor healing after a leg injury. Currently on methotrexate.   No personal history of heart disease or stroke, although his father had a stroke last year. He uses melatonin almost daily to aid sleep and has no diagnosis of insomnia.      Records Reviewed:  [facility] Sleep Office Visit Dr Ermalinda Memos 01/02/23 and 11/30/22 52 yoM hx of RA, GERD, BPH, OSA, AHI of 32 on HST 07/2017.  Sleep schedule 10-10:30 pm to 6:30 am. Wakes 1-3 times a night. Interested in [place]. Had a trial of CPAP in 2019 and did  not tolerate it.     Past Medical History:  Diagnosis Date   Arthritis    BPH (benign prostatic hyperplasia)    Deviated septum    Dyspnea    GERD (gastroesophageal reflux disease)    Hypercholesterolemia    Osteopenia    Rheumatoid arthritis(714.0)    Sleep apnea    Tibia and fibula open fracture, right     Past Surgical History:  Procedure Laterality Date   KNEE ARTHROSCOPY W/ MENISCECTOMY Right    WRIST ARTHROSCOPY Left 2004    Family History  Problem Relation Age of Onset   Hypertension Father    Atrial fibrillation Father    Hyperlipidemia Mother    Heart attack Paternal Grandfather    CAD Cousin    Heart disease Paternal Aunt    Colon cancer Neg Hx    Colon polyps Neg Hx    Esophageal cancer Neg Hx    Stomach cancer Neg Hx    Rectal cancer Neg Hx     Social History:  reports that he has never smoked. He has never used smokeless tobacco. He reports that he does not drink alcohol and does not use drugs.  Allergies:  Allergies  Allergen Reactions   Amoxicillin Rash   Pravastatin Palpitations   Rosuvastatin Palpitations   Simvastatin Palpitations    Medications: I have reviewed the patient's current medications.  The PMH, PSH, Medications, Allergies, and SH were reviewed and updated.  ROS: Constitutional: Negative  for fever, weight loss and weight gain. Cardiovascular: Negative for chest pain and dyspnea on exertion. Respiratory: Is not experiencing shortness of breath at rest. Gastrointestinal: Negative for nausea and vomiting. Neurological: Negative for headaches. Psychiatric: The patient is not nervous/anxious  Blood pressure 123/80, pulse 79, height 6' (1.829 m), weight 214 lb (97.1 kg), SpO2 95%.  PHYSICAL EXAM:  Exam: General: Well-developed, well-nourished Communication and Voice: Clear pitch and clarity Respiratory Respiratory effort: Equal inspiration and expiration without stridor Cardiovascular Peripheral Vascular: Warm extremities  with equal color/perfusion Eyes: No nystagmus with equal extraocular motion bilaterally Neuro/Psych/Balance: Patient oriented to person, place, and time; Appropriate mood and affect; Gait is intact with no imbalance; Cranial nerves I-XII are intact Head and Face Inspection: Normocephalic and atraumatic without mass or lesion Palpation: Facial skeleton intact without bony stepoffs Salivary Glands: No mass or tenderness Facial Strength: Facial motility symmetric and full bilaterally ENT Pinna: External ear intact and fully developed External canal: Canal is patent with intact skin Tympanic Membrane: Clear and mobile External Nose: No scar or anatomic deformity Internal Nose: Septum is deviated to the left with caudal spur on the right and significant narrowing of bilateral nasal passages (S- shaped septum). No polyp, or purulence. Mucosal edema and erythema present.  Bilateral inferior turbinate hypertrophy.  Lips, Teeth, and gums: Mucosa and teeth intact and viable TMJ: No pain to palpation with full mobility Oral cavity/oropharynx: No erythema or exudate, no lesions present 2+ tonsils Friedman 3-4 tongue position  Nasopharynx: No mass or lesion with intact mucosa Hypopharynx: Intact mucosa without pooling of secretions Larynx Glottic: Full true vocal cord mobility without lesion or mass Supraglottic: Normal appearing epiglottis and AE folds Interarytenoid Space: Moderate pachydermia&edema Subglottic Space: Patent without lesion or edema Neck Neck and Trachea: Midline trachea without mass or lesion Thyroid: No mass or nodularity Lymphatics: No lymphadenopathy  Procedure:    PROCEDURE NOTE: nasal endoscopy  Preoperative diagnosis: chronic nasal congestion and mouth breathing symptoms  Postoperative diagnosis: same  Procedure: Diagnostic nasal endoscopy (68768)  Surgeon: Elena Larry, M.D.  Anesthesia: Topical lidocaine  and Afrin  H&P REVIEW: The patient's history and  physical were reviewed today prior to procedure. All medications were reviewed and updated as well. Complications: None Condition is stable throughout exam Indications and consent: The patient presents with symptoms of chronic sinusitis not responding to previous therapies. All the risks, benefits, and potential complications were reviewed with the patient preoperatively and informed consent was obtained. The time out was completed with confirmation of the correct procedure.   Procedure: The patient was seated upright in the clinic. Topical lidocaine  and Afrin were applied to the nasal cavity. After adequate anesthesia had occurred, the rigid nasal endoscope was passed into the nasal cavity. The nasal mucosa, turbinates, septum, and sinus drainage pathways were visualized bilaterally. This revealed no purulence or significant secretions that might be cultured. There were no polyps or sites of significant inflammation. The mucosa was intact and there was no crusting present. The scope was then slowly withdrawn and the patient tolerated the procedure well. There were no complications or blood loss.    Studies Reviewed: NPSG in-lab sleep study 02/14/23 BMI 29 AHI of 52.8 (24 obstructive, 4 mixed and 6 central events and 330 hypopneas) ~ 2.7 % of central/mixed events    CT chest 2013 IMPRESSION:   1. No evidence of interstitial lung disease.  No findings to  explain the patient's shortness of breath.  2.  Tiny left renal stone.   Assessment/Plan: Encounter  Diagnoses  Name Primary?   Intolerance of continuous positive airway pressure (CPAP) ventilation Yes   OSA (obstructive sleep apnea)    BMI 29.0-29.9,adult    Deviated nasal septum    Chronic nasal congestion    Mouth breathing    Hypertrophy of both inferior nasal turbinates    Environmental and seasonal allergies     Assessment and Plan    Obstructive Sleep Apnea Diagnosed in 2019 with initial HST AHI of 30, but more recent in-lab  sleep study 02/2023 with AHI of 52.8. CPAP intolerance due to claustrophobia and discomfort. Considering Inspire implant. Discussed procedure, including device placement, drug-induced sleep endoscopy to rule out complete concentric collapse of the soft palate, and potential risks (infection, seroma, hematoma, nerve praxia, lung injury) and benefits (improved sleep apnea management). Advised avoiding strenuous activities for 3-4 weeks post-procedure.  OSA, moderate to severe, without multilevel collapse, with failure to tolerate PAP therapy and/or more conservative measures. Presence of smaller/absent tonsils (2+) and larger tongue position (Friedman 3-4 tongue position of III suggests that hypopharyngeal/retrolingual collapse is contributing to the patient's OSA. Zachery Conch, M et al. Staging of obstructive sleep apnea/hypopnea syndrome: a guide to appropriate treatment. Laryngoscope, 2004 Mar, 114(3):454-9. PMID: [id number]) Options including positional therapy, weight loss, oral appliances will likely be ineffective due to severity of OSA. PAP therapy and surgical correction discussed. Pt is not an ideal candidate for oral appliance due to severity of OSA Pt could be a candidate for Hypoglossal nerve stimulation (Inspire therapy) pending DISE results  - Order drug-induced sleep endoscopy  Chronic Nasal Congestion Septal Deviation and Inferior turbinate hypertrophy Chronic nasal congestion with deviated septum/ITH and significant narrowing of nasal passages on nasal endoscopy today. Symptoms include mouth breathing and nasal obstruction, especially when lying down. Previous nasal sprays (Flonase) ineffective. Septal deviation confirmed. Discussed nasal sprays and surgical options. - Prescribed Nasonex 2 puffs b/l nares + Ocean spray to avoid nasal dryness  - if fails medical management will consider Septo/ITR in the future   Environmental Allergies  - Prescribed Nasonex 2 puffs b/l nares + Ocean spray to  avoid nasal dryness - Zyrtec 10 mg daily at bedtime  - will consider allergy testing in the future   Rheumatoid Arthritis Diagnosed in 2001, well-controlled with methotrexate. Reports occasional nausea. No recent exacerbations. Discussed potential impact of methotrexate and Enbrel on wound healing and need to consult rheumatologist before surgery. - Continue methotrexate as prescribed - Consult rheumatologist regarding potential need to stop methotrexate and Enbrel before Inspire implant surgery  General Health Maintenance No history of heart disease or stroke. No current use of aspirin or blood thinners. Regular use of melatonin for sleep. - Continue melatonin as needed for sleep  Follow-up - Follow-up with surgery scheduling team for drug-induced sleep endoscopy - Discuss nasal congestion findings and potential septoplasty after endoscopy.      Thank you for allowing me to participate in the care of this patient. Please do not hesitate to contact me with any questions or concerns.   [name], MD Otolaryngology [facility] Phone: [phone] Fax: [phone]    05/19/2023, 5:22 PM

## 2023-05-31 ENCOUNTER — Other Ambulatory Visit: Payer: Self-pay

## 2023-05-31 ENCOUNTER — Encounter (HOSPITAL_COMMUNITY): Payer: Self-pay

## 2023-05-31 NOTE — Progress Notes (Signed)
PCP - Lupita Raider, MD  Cardiologist -   PPM/ICD - denies Device Orders - n/a Rep Notified - n/a  Chest x-ray -  EKG -  Stress Test - 07-14-15 ECHO - 07-14-19 Cardiac Cath -   CPAP - unable to tolerated  DM- denies  Blood Thinner Instructions: denies Aspirin Instructions: n/a  ERAS Protcol - NPO   COVID TEST- n/a  Anesthesia review: No  Patient verbally denies any shortness of breath, fever, cough and chest pain during phone call   -------------  SDW INSTRUCTIONS given:  Your procedure is scheduled on June 02, 2023.  Report to [facility] Main Entrance "A" at 10:30 A.M., and check in at the Admitting office.  Call this number if you have problems the morning of surgery:  [phone]   Remember:  Do not eat or drink after midnight the night before your surgery      Take these medicines the morning of surgery with A SIP OF WATER  fenofibrate     As of today, STOP taking any Aspirin (unless otherwise instructed by your surgeon) Aleve, Naproxen, Ibuprofen, Motrin, Advil, Goody's, BC's, all herbal medications, fish oil, and all vitamins.                      Do not wear jewelry, make up, or nail polish            Do not wear lotions, powders, perfumes/colognes, or deodorant.            Do not shave 48 hours prior to surgery.  Men may shave face and neck.            Do not bring valuables to the hospital.            [facility] is not responsible for any belongings or valuables.  Do NOT Smoke (Tobacco/Vaping) 24 hours prior to your procedure If you use a CPAP at night, you may bring all equipment for your overnight stay.   Contacts, glasses, dentures or bridgework may not be worn into surgery.      For patients admitted to the hospital, discharge time will be determined by your treatment team.   Patients discharged the day of surgery will not be allowed to drive home, and someone needs to stay with them for 24 hours.    Special instructions:   [facility]- Preparing For Surgery  Before surgery, you can play an important role. Because skin is not sterile, your skin needs to be as free of germs as possible. You can reduce the number of germs on your skin by washing with CHG (chlorahexidine gluconate) Soap before surgery.  CHG is an antiseptic cleaner which kills germs and bonds with the skin to continue killing germs even after washing.    Oral Hygiene is also important to reduce your risk of infection.  Remember - BRUSH YOUR TEETH THE MORNING OF SURGERY WITH YOUR REGULAR TOOTHPASTE  Please do not use if you have an allergy to CHG or antibacterial soaps. If your skin becomes reddened/irritated stop using the CHG.  Do not shave (including legs and underarms) for at least 48 hours prior to first CHG shower. It is OK to shave your face.  Please follow these instructions carefully.   Shower the NIGHT BEFORE SURGERY and the MORNING OF SURGERY with DIAL Soap.   Pat yourself dry with a CLEAN TOWEL.  Wear CLEAN PAJAMAS to bed the night before surgery  Place CLEAN SHEETS  on your bed the night of your first shower and DO NOT SLEEP WITH PETS.   Day of Surgery: Please shower morning of surgery  Wear Clean/Comfortable clothing the morning of surgery Do not apply any deodorants/lotions.   Remember to brush your teeth WITH YOUR REGULAR TOOTHPASTE.   Questions were answered. Patient verbalized understanding of instructions.

## 2023-06-02 ENCOUNTER — Other Ambulatory Visit (INDEPENDENT_AMBULATORY_CARE_PROVIDER_SITE_OTHER): Payer: Self-pay | Admitting: Otolaryngology

## 2023-06-02 ENCOUNTER — Ambulatory Visit (HOSPITAL_COMMUNITY): Payer: 59

## 2023-06-02 ENCOUNTER — Encounter (HOSPITAL_COMMUNITY)
Admission: RE | Disposition: A | Discharge status: Home / Self Care | Payer: Self-pay | Source: Home / Self Care | Attending: Otolaryngology

## 2023-06-02 ENCOUNTER — Encounter (HOSPITAL_COMMUNITY): Payer: Self-pay | Admitting: *Deleted

## 2023-06-02 ENCOUNTER — Other Ambulatory Visit: Payer: Self-pay

## 2023-06-02 ENCOUNTER — Ambulatory Visit (HOSPITAL_COMMUNITY)
Admission: RE | Admit: 2023-06-02 | Discharge: 2023-06-02 | Disposition: A | Discharge status: Home / Self Care | Payer: 59 | Attending: Otolaryngology | Admitting: Otolaryngology

## 2023-06-02 DIAGNOSIS — M069 Rheumatoid arthritis, unspecified: Secondary | ICD-10-CM | POA: Diagnosis not present

## 2023-06-02 DIAGNOSIS — G4733 Obstructive sleep apnea (adult) (pediatric): Secondary | ICD-10-CM

## 2023-06-02 DIAGNOSIS — Z789 Other specified health status: Secondary | ICD-10-CM

## 2023-06-02 DIAGNOSIS — J343 Hypertrophy of nasal turbinates: Secondary | ICD-10-CM

## 2023-06-02 DIAGNOSIS — Z79631 Long term (current) use of antimetabolite agent: Secondary | ICD-10-CM | POA: Diagnosis not present

## 2023-06-02 DIAGNOSIS — J342 Deviated nasal septum: Secondary | ICD-10-CM

## 2023-06-02 HISTORY — PX: DRUG INDUCED ENDOSCOPY: SHX6808

## 2023-06-02 LAB — BASIC METABOLIC PANEL
Anion gap: 13 (ref 5–15)
BUN: 19 mg/dL (ref 6–20)
CO2: 20 mmol/L — ABNORMAL LOW (ref 22–32)
Calcium: 9.3 mg/dL (ref 8.9–10.3)
Chloride: 105 mmol/L (ref 98–111)
Creatinine, Ser: 1.19 mg/dL (ref 0.61–1.24)
GFR, Estimated: >60 mL/min (ref >60)
Glucose, Bld: 96 mg/dL (ref 70–99)
Potassium: 4.2 mmol/L (ref 3.5–5.1)
Sodium: 138 mmol/L (ref 135–145)

## 2023-06-02 LAB — CBC
HCT: 46.2 % (ref 39.0–52.0)
Hemoglobin: 15.4 g/dL (ref 13.0–17.0)
MCH: 30.9 pg (ref 26.0–34.0)
MCHC: 33.3 g/dL (ref 30.0–36.0)
MCV: 92.8 fL (ref 80.0–100.0)
Platelets: 312 10*3/uL (ref 150–400)
RBC: 4.98 MIL/uL (ref 4.22–5.81)
RDW: 13.7 % (ref 11.5–15.5)
WBC: 8.1 10*3/uL (ref 4.0–10.5)
nRBC: 0 % (ref 0.0–0.2)

## 2023-06-02 SURGERY — DRUG INDUCED SLEEP ENDOSCOPY
Anesthesia: Monitor Anesthesia Care | Site: Mouth

## 2023-06-02 MED ORDER — LACTATED RINGERS IV SOLN: INTRAVENOUS | Status: DC

## 2023-06-02 MED ORDER — PROPOFOL 500 MG/50ML IV EMUL
INTRAVENOUS | Status: DC | PRN
  Administered 2023-06-02: 100 ug/kg/min via INTRAVENOUS

## 2023-06-02 MED ORDER — ORAL CARE MOUTH RINSE: 15.0000 mL | Freq: Once | OROMUCOSAL | Status: AC

## 2023-06-02 MED ORDER — ONDANSETRON 4 MG PO TBDP: 4.0000 mg | ORAL_TABLET | Freq: Three times a day (TID) | ORAL | 0 refills | Status: AC | PRN

## 2023-06-02 MED ORDER — OXYMETAZOLINE HCL 0.05 % NA SOLN
NASAL | Status: AC
  Filled 2023-06-02: qty 30

## 2023-06-02 MED ORDER — ONDANSETRON HCL 4 MG/2ML IJ SOLN: 4.0000 mg | Freq: Four times a day (QID) | INTRAMUSCULAR | Status: DC | PRN

## 2023-06-02 MED ORDER — LIDOCAINE 2% (20 MG/ML) 5 ML SYRINGE
INTRAMUSCULAR | Status: DC | PRN
  Administered 2023-06-02: 60 mg via INTRAVENOUS

## 2023-06-02 MED ORDER — CHLORHEXIDINE GLUCONATE 0.12 % MT SOLN
15.0000 mL | Freq: Once | OROMUCOSAL | Status: AC
  Administered 2023-06-02: 15 mL via OROMUCOSAL
  Filled 2023-06-02: qty 15

## 2023-06-02 MED ORDER — FENTANYL CITRATE (PF) 100 MCG/2ML IJ SOLN: 25.0000 ug | INTRAMUSCULAR | Status: DC | PRN

## 2023-06-02 MED ORDER — LIDOCAINE-EPINEPHRINE 1 %-1:100000 IJ SOLN
INTRAMUSCULAR | Status: AC
  Filled 2023-06-02: qty 1

## 2023-06-02 MED ORDER — OXYCODONE HCL 5 MG/5ML PO SOLN: 5.0000 mg | Freq: Once | ORAL | Status: DC | PRN

## 2023-06-02 MED ORDER — PROPOFOL 10 MG/ML IV BOLUS
INTRAVENOUS | Status: AC
  Filled 2023-06-02: qty 20

## 2023-06-02 MED ORDER — OXYCODONE HCL 5 MG PO TABS: 5.0000 mg | ORAL_TABLET | Freq: Once | ORAL | Status: DC | PRN

## 2023-06-02 SURGICAL SUPPLY — 1 items: BRONCHOSCOPE PED SLIM DISP (MISCELLANEOUS) IMPLANT

## 2023-06-02 NOTE — Anesthesia Preprocedure Evaluation (Signed)
Anesthesia Evaluation  Patient identified by MRN, date of birth, ID band Patient awake    Reviewed: Allergy & Precautions, H&P , NPO status , Patient's Chart, lab work & pertinent test results  Airway Mallampati: II   Neck ROM: full    Dental   Pulmonary sleep apnea    breath sounds clear to auscultation       Cardiovascular negative cardio ROS  Rhythm:regular Rate:Normal     Neuro/Psych    GI/Hepatic ,GERD  ,,  Endo/Other    Renal/GU      Musculoskeletal  (+) Arthritis , Rheumatoid disorders,    Abdominal   Peds  Hematology   Anesthesia Other Findings   Reproductive/Obstetrics                             Anesthesia Physical Anesthesia Plan  ASA: 2  Anesthesia Plan: General   Post-op Pain Management:    Induction: Intravenous  PONV Risk Score and Plan: 2 and Propofol infusion and Treatment may vary due to age or medical condition  Airway Management Planned: Nasal Cannula  Additional Equipment:   Intra-op Plan:   Post-operative Plan:   Informed Consent: I have reviewed the patients History and Physical, chart, labs and discussed the procedure including the risks, benefits and alternatives for the proposed anesthesia with the patient or authorized representative who has indicated his/her understanding and acceptance.     Dental advisory given  Plan Discussed with: CRNA, Anesthesiologist and Surgeon  Anesthesia Plan Comments:        Anesthesia Quick Evaluation

## 2023-06-02 NOTE — Interval H&P Note (Signed)
History and Physical Interval Note:  06/02/2023 12:14 PM  Gregory [name]  has presented today for surgery, with the diagnosis of Obstructive sleep apnea.  The various methods of treatment have been discussed with the patient and family. After consideration of risks, benefits and other options for treatment, the patient has consented to  Procedure(s): DRUG INDUCED ENDOSCOPY (N/A) as a surgical intervention.  The patient's history has been reviewed, patient examined, no change in status, stable for surgery.  I have reviewed the patient's chart and labs.  Questions were answered to the patient's satisfaction.     [name]

## 2023-06-02 NOTE — Discharge Instructions (Addendum)
DRUG-INDUCED SLEEP ENDOSCOPY POST-OPERATIVE INSTRUCTIONS:  Based on the drug-induced sleep endoscopy today, you were deemed a candidate for Inspire Therapy.  Please review post-operative and recovery instructions below that you will need to be aware of after Inspire Implant surgery.  Please restart all of your home medications if you take anything on a daily basis.  You can resume regular diet after this procedure.  You will be scheduled for pre-operative appointment with Dr. Irene Pap to review details about surgery and to discuss the next steps.   DIET: Resume normal diet HYGIENE: Please wait until 48 hours after surgery before getting incisions on neck, chest, and torso wet. In the first 48 hours after surgery, will likely need to take sponge baths. WOUND CARE: Please leave pressure dressing on for 48 hours after surgery. Gently place antibiotic ointment over incisions 2 times per day; use clean q-tip. May place a clean bandage over incisions as needed. After 48 hours, you may get incisions wet with warm soap and water, but do not soak the incisions.  Pat area dry gently.  Immediately place antibiotic ointment. Take oral antibiotics as prescribed If skin around incision starts to get red (> 1cm), swollen, and/or more painful, please call the office ACTIVITY: Try to avoid sleeping on the side of your surgery, to the extent possible.   You may walk for exercise starting the day after surgery. For 2 weeks: Do not pick up anything greater than 5 pounds with the hand/arm that's on the same side as the surgery.  After 2 weeks, you may increase weight to 10 pounds.   Consider performing neck rolls 10 clockwise and 10 counterclockwise 3x/day. For 4 weeks, no strenuous activity (running, jogging, lifting weights, gardening, sports) or until cleared by physician.   PAIN MEDICATIONS: You will be prescribed Oxycodone for pain.   If pain is not severe, consider taking Tylenol 650mg  every 6  hours Avoid aspirin for 7 days after surgery Avoid direct heat (such as heating pads) to incision sites.   May gently place ice over surgery sites as needed.  Please place a thin clean towel over skin first and then place ice bag over towel.  Ice for 10 minutes at a time only.  POST-OPERATIVE CLINIC APPOINTMENTS: 1 week: suture removal and wound check in the office.  1 month: device activation and wound check in the office. 2.5 months: check in visit to assess usage. 3-4 months: titration sleep study based on usage of >4 hrs/night.  4 months: final wound check in the office.  Yearly: device check at office.  SCAR CARE: After incisions have healed, you will have a scar, which will continue to evolve over the course of 12 months.  Caring for your incision scars will help them to be as minimal as possible. If you are out in the sun with incision exposure, please remember to place sunscreen over the incision and surrounding skin.   You may use vitamin E or "Scar ointment/cream" to help soften scar.  Please wait one month after surgery before starting this.

## 2023-06-02 NOTE — Op Note (Signed)
 Operative note  Preoperative diagnosis: OSA Postoperative diagnosis:   Same  Procedure:DISE ( Drug induced sleep endoscopy)  Anesthesia: Topical lidocaine gel Complications: None Condition is stable throughout exam  Indications and consent:   The patient presents to my otolaryngology clinic with a chief complaint of OSA  Because of pt's OSA and desire to be a candidate for Inspire therapy, it was recommended that they undergo a flexible fiberoptic laryngoscopy under sedation (DISE).   All the risks, benefits, and potential complications were reviewed with the patient preoperatively and informed consent was obtained.  Procedure: Pt was brought back to the OR and laid supine on the table. Propofol anesthesia was administered per protocol until pt reached optimal level of sedation. DISE exam showed the following anatomic collapse pattern.  VOTE classification Velopharynx- A-P 2 Oropharynx- 1 Tongue base- 2 Epiglottis- 2   Based on the DISE findings, pt is a candidate for Hypoglossal nerve stimulation (Inspire therapy) based on the above anatomy.

## 2023-06-02 NOTE — Transfer of Care (Signed)
Immediate Anesthesia Transfer of Care Note  Patient: Gregory [name]  Procedure(s) Performed: DRUG INDUCED ENDOSCOPY (Mouth)  Patient Location: PACU  Anesthesia Type:MAC  Level of Consciousness: awake, oriented, and drowsy  Airway & Oxygen Therapy: Patient Spontanous Breathing  Post-op Assessment: Report given to RN and Post -op Vital signs reviewed and stable  Post vital signs: Reviewed and stable  Last Vitals:  Vitals Value Taken Time  BP 111/80 06/02/23 1332  Temp    Pulse 87 06/02/23 1333  Resp 15 06/02/23 1333  SpO2 97 % 06/02/23 1333  Vitals shown include unfiled device data.  Last Pain:  Vitals:   06/02/23 1134  TempSrc:   PainSc: 0-No pain         Complications: No notable events documented.

## 2023-06-05 ENCOUNTER — Encounter (HOSPITAL_COMMUNITY): Payer: Self-pay | Admitting: Otolaryngology

## 2023-06-05 NOTE — Anesthesia Postprocedure Evaluation (Signed)
 Anesthesia Post Note  Patient: Gregory [name]  Procedure(s) Performed: DRUG INDUCED ENDOSCOPY (Mouth)     Patient location during evaluation: PACU Anesthesia Type: MAC Level of consciousness: awake and alert Pain management: pain level controlled Vital Signs Assessment: post-procedure vital signs reviewed and stable Respiratory status: spontaneous breathing, nonlabored ventilation, respiratory function stable and patient connected to nasal cannula oxygen Cardiovascular status: stable and blood pressure returned to baseline Postop Assessment: no apparent nausea or vomiting Anesthetic complications: no   No notable events documented.  Last Vitals:  Vitals:   06/02/23 1330 06/02/23 1345  BP: 111/80 125/87  Pulse: 85 69  Resp: 15 15  Temp: 36.8 C   SpO2: 96% 90%    Last Pain:  Vitals:   06/02/23 1330  TempSrc:   PainSc: 0-No pain                 Roschelle Calandra S

## 2023-06-22 ENCOUNTER — Encounter (INDEPENDENT_AMBULATORY_CARE_PROVIDER_SITE_OTHER): Payer: Self-pay | Admitting: Otolaryngology

## 2023-06-22 ENCOUNTER — Ambulatory Visit (INDEPENDENT_AMBULATORY_CARE_PROVIDER_SITE_OTHER): Admitting: Otolaryngology

## 2023-06-22 VITALS — BP 133/76 | HR 78

## 2023-06-22 DIAGNOSIS — J342 Deviated nasal septum: Secondary | ICD-10-CM | POA: Diagnosis not present

## 2023-06-22 DIAGNOSIS — R0981 Nasal congestion: Secondary | ICD-10-CM

## 2023-06-22 DIAGNOSIS — Z789 Other specified health status: Secondary | ICD-10-CM

## 2023-06-22 DIAGNOSIS — M069 Rheumatoid arthritis, unspecified: Secondary | ICD-10-CM

## 2023-06-22 DIAGNOSIS — J343 Hypertrophy of nasal turbinates: Secondary | ICD-10-CM

## 2023-06-22 DIAGNOSIS — J3089 Other allergic rhinitis: Secondary | ICD-10-CM

## 2023-06-22 DIAGNOSIS — R065 Mouth breathing: Secondary | ICD-10-CM

## 2023-06-22 DIAGNOSIS — G4733 Obstructive sleep apnea (adult) (pediatric): Secondary | ICD-10-CM

## 2023-06-22 DIAGNOSIS — Z6829 Body mass index (BMI) 29.0-29.9, adult: Secondary | ICD-10-CM

## 2023-06-22 NOTE — H&P (View-Only) (Signed)
 ENT Progress Note:   Update 06/22/2023  Discussed the use of AI scribe software for clinical note transcription with the patient, who gave verbal consent to proceed.  History of Present Illness   The patient presents with questions regarding the upcoming Inspire implant surgery.  He is scheduled for an upcoming Inspire implant surgery 07/18/23 and has several questions regarding the procedure and recovery process. He is concerned about coordinating his work schedule and understanding post-operative restrictions.   He is concerned about the impact of his current medications on the surgery. He is taking Enbrel and methotrexate for arthritis and plans to discuss this further with his rheumatologist at an upcoming appointment.  He has a history of chronic nasal congestion, deviated septum, allergies and has previously tried nasal sprays and decongestants without success. He plans to revisit this issue after the [facility] surgery, as he is concerned about potential nasal breathing difficulties post-implantation.  He is managing family responsibilities under FMLA due to his mother's care needs following his father's passing last year.      Records Reviewed:  Initial Evaluation  Reason for Consult: OSA CPAP intolerance  Referring provider: Dr Dwyane Luo Sleep     HPI: Discussed the use of AI scribe software for clinical note transcription with the patient, who gave verbal consent to proceed.  History of Present Illness   Gregory [name] is a 53 year old male with hx of sleep apnea who presents with CPAP intolerance and nasal breathing issues. He was referred by Dr. Ermalinda Memos for evaluation of sleep apnea and potential Inspire Implant.  He has a history of sleep apnea diagnosed in 2019 and has been intolerant of CPAP therapy. He attempted using a full face mask for six to nine months but experienced claustrophobia, waking up with the mask off, and difficulty returning to sleep due to air  pressure. He discontinued CPAP use in 2019.  He is a mouth breather with a history of chronic nasal congestion and a possible deviated septum. He has worsening of baseline nasal congestion when lying down and occasionally wakes up with one side completely closed. Nasal sprays like Flonase have been ineffective. No prior nasal or sinus procedures.   He has rheumatoid arthritis, diagnosed around 2001, and has been on methotrexate with some nausea as a side effect. His condition has been controlled since 2005-2006. He reports swelling and pain in his hands, wrists, knees, and ankles, but no significant issues currently. Had poor healing in the past when on Enbrel, and stopped it due to poor healing after a leg injury. Currently on methotrexate.   No personal history of heart disease or stroke, although his father had a stroke last year. He uses melatonin almost daily to aid sleep and has no diagnosis of insomnia.      Records Reviewed:  [facility] Sleep Office Visit Dr Ermalinda Memos 01/02/23 and 11/30/22 52 yoM hx of RA, GERD, BPH, OSA, AHI of 32 on HST 07/2017.  Sleep schedule 10-10:30 pm to 6:30 am. Wakes 1-3 times a night. Interested in [place]. Had a trial of CPAP in 2019 and did not tolerate it.     Past Medical History:  Diagnosis Date   Arthritis    BPH (benign prostatic hyperplasia)    Deviated septum    Dyspnea    GERD (gastroesophageal reflux disease)    Hypercholesterolemia    Osteopenia    Rheumatoid arthritis(714.0)    Sleep apnea    Tibia and fibula open fracture, right  Past Surgical History:  Procedure Laterality Date   DRUG INDUCED ENDOSCOPY N/A 06/02/2023   Procedure: DRUG INDUCED ENDOSCOPY;  Surgeon: [name], MD;  Location: [facility] OR;  Service: ENT;  Laterality: N/A;   KNEE ARTHROSCOPY W/ MENISCECTOMY Right    WRIST ARTHROSCOPY Left 2004    Family History  Problem Relation Age of Onset   Hypertension Father    Atrial fibrillation Father    Hyperlipidemia Mother     Heart attack Paternal Grandfather    CAD Cousin    Heart disease Paternal Aunt    Colon cancer Neg Hx    Colon polyps Neg Hx    Esophageal cancer Neg Hx    Stomach cancer Neg Hx    Rectal cancer Neg Hx     Social History:  reports that he has never smoked. He has never used smokeless tobacco. He reports that he does not drink alcohol and does not use drugs.  Allergies:  Allergies  Allergen Reactions   Amoxicillin Rash   Pravastatin Palpitations   Rosuvastatin Palpitations   Simvastatin Palpitations    Medications: I have reviewed the patient's current medications.  The PMH, PSH, Medications, Allergies, and SH were reviewed and updated.  ROS: Constitutional: Negative for fever, weight loss and weight gain. Cardiovascular: Negative for chest pain and dyspnea on exertion. Respiratory: Is not experiencing shortness of breath at rest. Gastrointestinal: Negative for nausea and vomiting. Neurological: Negative for headaches. Psychiatric: The patient is not nervous/anxious  Blood pressure 133/76, pulse 78, SpO2 96%.  PHYSICAL EXAM:  Exam: General: Well-developed, well-nourished Communication and Voice: Clear pitch and clarity Respiratory Respiratory effort: Equal inspiration and expiration without stridor Cardiovascular Peripheral Vascular: Warm extremities with equal color/perfusion Eyes: No nystagmus with equal extraocular motion bilaterally Neuro/Psych/Balance: Patient oriented to person, place, and time; Appropriate mood and affect; Gait is intact with no imbalance; Cranial nerves I-XII are intact Head and Face Inspection: Normocephalic and atraumatic without mass or lesion Facial Strength: Facial motility symmetric and full bilaterally ENT Pinna: External ear intact and fully developed External canal: Canal is patent with intact skin Tympanic Membrane: Clear and mobile External Nose: No scar or anatomic deformity    Studies Reviewed: NPSG in-lab sleep study  02/14/23 BMI 29 AHI of 52.8 (24 obstructive, 4 mixed and 6 central events and 330 hypopneas) ~ 2.7 % of central/mixed events    CT chest 2013 IMPRESSION:   1. No evidence of interstitial lung disease.  No findings to  explain the patient's shortness of breath.  2.  Tiny left renal stone.   Assessment/Plan: Encounter Diagnoses  Name Primary?   OSA (obstructive sleep apnea) Yes   Intolerance of continuous positive airway pressure (CPAP) ventilation    BMI 29.0-29.9,adult    Mouth breathing    Chronic nasal congestion    Deviated nasal septum    Hypertrophy of both inferior nasal turbinates    Environmental and seasonal allergies      Assessment and Plan    Obstructive Sleep Apnea Diagnosed in 2019 with initial HST AHI of 30, but more recent in-lab sleep study 02/2023 with AHI of 52.8. CPAP intolerance due to claustrophobia and discomfort. Considering Inspire implant. Discussed procedure, including device placement, drug-induced sleep endoscopy to rule out complete concentric collapse of the soft palate, and potential risks (infection, seroma, hematoma, nerve praxia, lung injury) and benefits (improved sleep apnea management). Advised avoiding strenuous activities for 3-4 weeks post-procedure.  OSA, moderate to severe, without multilevel collapse, with failure to tolerate PAP  therapy and/or more conservative measures. Presence of smaller/absent tonsils (2+) and larger tongue position (Friedman 3-4 tongue position of III suggests that hypopharyngeal/retrolingual collapse is contributing to the patient's OSA. Zachery Conch, M et al. Staging of obstructive sleep apnea/hypopnea syndrome: a guide to appropriate treatment. Laryngoscope, 2004 Mar, 114(3):454-9. PMID: 16109604) Options including positional therapy, weight loss, oral appliances will likely be ineffective due to severity of OSA. PAP therapy and surgical correction discussed. Pt is not an ideal candidate for oral appliance due to  severity of OSA Pt could be a candidate for Hypoglossal nerve stimulation (Inspire therapy) pending DISE results  - Order drug-induced sleep endoscopy  Chronic Nasal Congestion Septal Deviation and Inferior turbinate hypertrophy Chronic nasal congestion with deviated septum/ITH and significant narrowing of nasal passages on nasal endoscopy today. Symptoms include mouth breathing and nasal obstruction, especially when lying down. Previous nasal sprays (Flonase) ineffective. Septal deviation confirmed. Discussed nasal sprays and surgical options. - Prescribed Nasonex 2 puffs b/l nares + Ocean spray to avoid nasal dryness  - if fails medical management will consider Septo/ITR in the future   Environmental Allergies  - Prescribed Nasonex 2 puffs b/l nares + Ocean spray to avoid nasal dryness - Zyrtec 10 mg daily at bedtime  - will consider allergy testing in the future   Rheumatoid Arthritis Diagnosed in 2001, well-controlled with methotrexate. Reports occasional nausea. No recent exacerbations. Discussed potential impact of methotrexate and Enbrel on wound healing and need to consult rheumatologist before surgery. - Continue methotrexate as prescribed - Consult rheumatologist regarding potential need to stop methotrexate and Enbrel before Inspire implant surgery  General Health Maintenance No history of heart disease or stroke. No current use of aspirin or blood thinners. Regular use of melatonin for sleep. - Continue melatonin as needed for sleep  Follow-up - Follow-up with surgery scheduling team for drug-induced sleep endoscopy - Discuss nasal congestion findings and potential septoplasty after endoscopy.     Update 06/22/2023 Assessment and Plan    Obstructive Sleep Apnea (OSA)   He is a candidate for Inspire therapy based on PSG and no evidence of complete concentric collapse at the palate during DISE. Preapproved for the procedure done, he was informed about the surgical process,  including incision locations and post-operative care. Concerns about work-related physical activity post-surgery were addressed, advising avoidance of strenuous activities for four weeks to prevent device dislodgement, which could require complex revision surgery. He talked about the safety of flying and MRI compatibility of the device. Post-operative follow-up will be with the sleep physician for device activation and monitoring 4 weeks post-op. The Inspire device senses breathing and stimulates the tongue to prevent airway obstruction, with settings adjusted post-operatively to ensure efficacy.   - Proceed with Inspire implant surgery as scheduled.   - Provide post-operative recovery instructions via MyChart and printed copy.   - Advised to avoid strenuous activities for four weeks post-surgery to prevent device dislodgement.   - Coordinate with HR for work-related leave and restrictions.   - Provide a note for work restrictions if needed.   - Refer to sleep physician for post-operative follow-up and device activation (Dr Dwyane Luo Sleep).    Deviated Nasal Septum/ITH and chronic nasal congestion    Exam during initial evaluation: Internal Nose: Septum is deviated to the left with caudal spur on the right and significant narrowing of bilateral nasal passages (S- shaped septum). No polyp, or purulence. Mucosal edema and erythema present.  Bilateral inferior turbinate hypertrophy.   He has an S-shaped  septal deviation causing nasal congestion and mouth breathing. Previous trials of nasal sprays and decongestants were unsuccessful, but he plans to retry them post-Inspire surgery. Agreed to retry medical management before considering surgical intervention. Concerned about nasal breathing affecting Inspire device efficacy.   - Prescribed Nasonex 2 puffs b/l nares + Ocean spray to avoid nasal dryness - Zyrtec 10 mg daily at bedtime .    Rheumatoid Arthritis   He is on Enbrel and methotrexate. The  rheumatologist advised stopping Enbrel one week before and after surgery to avoid delayed healing. Plans to discuss methotrexate use with the rheumatologist.   - Hold Enbrel one week before and one week after surgery.   - Consult rheumatologist regarding methotrexate use around the time of surgery.    Follow-up   He will have follow-up appointments for post-operative care and device activation. Plans to see the rheumatologist at the end of the month for routine follow-up and medication management discussion around surgery.   - Schedule post-operative follow-up visit two weeks after surgery.   - Coordinate follow-up with sleep physician for device activation four weeks post-surgery.   - Plan rheumatology follow-up at the end of the month.          [name], MD Otolaryngology [facility] Phone: [phone] Fax: [phone]    06/22/2023, 9:58 AM

## 2023-06-22 NOTE — Patient Instructions (Signed)
 It was very nice to meet you today,   Please see the following link for the [facility] program   [email or web address]  This website has information about how you can connect to other patients who have had Inspire Implant procedure  POST-OPERATIVE INSTRUCTIONS:   Please restart all of your home medications if you take anything on a daily basis.  You can resume regular diet after this procedure.   DIET: Resume normal diet HYGIENE: Please wait until 48 hours after surgery before getting incisions on neck, chest, and torso wet. In the first 48 hours after surgery, will likely need to take sponge baths. WOUND CARE: Please leave pressure dressing on for 48 hours after surgery. Gently place antibiotic ointment over incisions 2 times per day; use clean q-tip. May place a clean bandage over incisions as needed. After 48 hours, you may get incisions wet with warm soap and water, but do not soak the incisions.  Pat area dry gently.  Immediately place antibiotic ointment. Take oral antibiotics as prescribed If skin around incision starts to get red (> 1cm), swollen, and/or more painful, please call the office ACTIVITY: Try to avoid sleeping on the side of your surgery, to the extent possible.   You may walk for exercise starting the day after surgery. For 2 weeks: Do not pick up anything greater than 5 pounds with the hand/arm that's on the same side as the surgery.  After 2 weeks, you may increase weight to 10 pounds.   Consider performing neck rolls 10 clockwise and 10 counterclockwise 3x/day. For 4 weeks, no strenuous activity (running, jogging, lifting weights, gardening, sports) or until cleared by physician.   PAIN MEDICATIONS: You will be prescribed Oxycodone for pain.   Take Tylenol and Motrin extra strength every 6 hrs staggered 3 hrs apart from each other Avoid aspirin for 7 days after surgery Avoid direct heat (such as heating pads) to incision  sites.   May gently place ice over surgery sites as needed.  Please place a thin clean towel over skin first and then place ice bag over towel.  Ice for 10 minutes at a time only.  POST-OPERATIVE CLINIC APPOINTMENTS: 1 week: suture removal and wound check in the office.  1 month: device activation and wound check in the office. 2.5 months: check in visit to assess usage. 3-4 months: titration sleep study based on usage of >4 hrs/night.  4 months: final wound check in the office.  Yearly: device check at office.  SCAR CARE: After incisions have healed, you will have a scar, which will continue to evolve over the course of 12 months.  Caring for your incision scars will help them to be as minimal as possible. If you are out in the sun with incision exposure, please remember to place sunscreen over the incision and surrounding skin.   You may use vitamin E or "Scar ointment/cream" to help soften scar.  Please wait one month after surgery before starting this.

## 2023-06-22 NOTE — Progress Notes (Signed)
 ENT Progress Note:   Update 06/22/2023  Discussed the use of AI scribe software for clinical note transcription with the patient, who gave verbal consent to proceed.  History of Present Illness   The patient presents with questions regarding the upcoming Inspire implant surgery.  He is scheduled for an upcoming Inspire implant surgery 07/18/23 and has several questions regarding the procedure and recovery process. He is concerned about coordinating his work schedule and understanding post-operative restrictions.   He is concerned about the impact of his current medications on the surgery. He is taking Enbrel and methotrexate for arthritis and plans to discuss this further with his rheumatologist at an upcoming appointment.  He has a history of chronic nasal congestion, deviated septum, allergies and has previously tried nasal sprays and decongestants without success. He plans to revisit this issue after the [facility] surgery, as he is concerned about potential nasal breathing difficulties post-implantation.  He is managing family responsibilities under FMLA due to his mother's care needs following his father's passing last year.      Records Reviewed:  Initial Evaluation  Reason for Consult: OSA CPAP intolerance  Referring provider: Dr Dwyane Luo Sleep     HPI: Discussed the use of AI scribe software for clinical note transcription with the patient, who gave verbal consent to proceed.  History of Present Illness   Gregory [name] is a 53 year old male with hx of sleep apnea who presents with CPAP intolerance and nasal breathing issues. He was referred by Dr. Ermalinda Memos for evaluation of sleep apnea and potential Inspire Implant.  He has a history of sleep apnea diagnosed in 2019 and has been intolerant of CPAP therapy. He attempted using a full face mask for six to nine months but experienced claustrophobia, waking up with the mask off, and difficulty returning to sleep due to air  pressure. He discontinued CPAP use in 2019.  He is a mouth breather with a history of chronic nasal congestion and a possible deviated septum. He has worsening of baseline nasal congestion when lying down and occasionally wakes up with one side completely closed. Nasal sprays like Flonase have been ineffective. No prior nasal or sinus procedures.   He has rheumatoid arthritis, diagnosed around 2001, and has been on methotrexate with some nausea as a side effect. His condition has been controlled since 2005-2006. He reports swelling and pain in his hands, wrists, knees, and ankles, but no significant issues currently. Had poor healing in the past when on Enbrel, and stopped it due to poor healing after a leg injury. Currently on methotrexate.   No personal history of heart disease or stroke, although his father had a stroke last year. He uses melatonin almost daily to aid sleep and has no diagnosis of insomnia.      Records Reviewed:  [facility] Sleep Office Visit Dr Ermalinda Memos 01/02/23 and 11/30/22 52 yoM hx of RA, GERD, BPH, OSA, AHI of 32 on HST 07/2017.  Sleep schedule 10-10:30 pm to 6:30 am. Wakes 1-3 times a night. Interested in [place]. Had a trial of CPAP in 2019 and did not tolerate it.     Past Medical History:  Diagnosis Date   Arthritis    BPH (benign prostatic hyperplasia)    Deviated septum    Dyspnea    GERD (gastroesophageal reflux disease)    Hypercholesterolemia    Osteopenia    Rheumatoid arthritis(714.0)    Sleep apnea    Tibia and fibula open fracture, right  Past Surgical History:  Procedure Laterality Date   DRUG INDUCED ENDOSCOPY N/A 06/02/2023   Procedure: DRUG INDUCED ENDOSCOPY;  Surgeon: [name], MD;  Location: [facility] OR;  Service: ENT;  Laterality: N/A;   KNEE ARTHROSCOPY W/ MENISCECTOMY Right    WRIST ARTHROSCOPY Left 2004    Family History  Problem Relation Age of Onset   Hypertension Father    Atrial fibrillation Father    Hyperlipidemia Mother     Heart attack Paternal Grandfather    CAD Cousin    Heart disease Paternal Aunt    Colon cancer Neg Hx    Colon polyps Neg Hx    Esophageal cancer Neg Hx    Stomach cancer Neg Hx    Rectal cancer Neg Hx     Social History:  reports that he has never smoked. He has never used smokeless tobacco. He reports that he does not drink alcohol and does not use drugs.  Allergies:  Allergies  Allergen Reactions   Amoxicillin Rash   Pravastatin Palpitations   Rosuvastatin Palpitations   Simvastatin Palpitations    Medications: I have reviewed the patient's current medications.  The PMH, PSH, Medications, Allergies, and SH were reviewed and updated.  ROS: Constitutional: Negative for fever, weight loss and weight gain. Cardiovascular: Negative for chest pain and dyspnea on exertion. Respiratory: Is not experiencing shortness of breath at rest. Gastrointestinal: Negative for nausea and vomiting. Neurological: Negative for headaches. Psychiatric: The patient is not nervous/anxious  Blood pressure 133/76, pulse 78, SpO2 96%.  PHYSICAL EXAM:  Exam: General: Well-developed, well-nourished Communication and Voice: Clear pitch and clarity Respiratory Respiratory effort: Equal inspiration and expiration without stridor Cardiovascular Peripheral Vascular: Warm extremities with equal color/perfusion Eyes: No nystagmus with equal extraocular motion bilaterally Neuro/Psych/Balance: Patient oriented to person, place, and time; Appropriate mood and affect; Gait is intact with no imbalance; Cranial nerves I-XII are intact Head and Face Inspection: Normocephalic and atraumatic without mass or lesion Facial Strength: Facial motility symmetric and full bilaterally ENT Pinna: External ear intact and fully developed External canal: Canal is patent with intact skin Tympanic Membrane: Clear and mobile External Nose: No scar or anatomic deformity    Studies Reviewed: NPSG in-lab sleep study  02/14/23 BMI 29 AHI of 52.8 (24 obstructive, 4 mixed and 6 central events and 330 hypopneas) ~ 2.7 % of central/mixed events    CT chest 2013 IMPRESSION:   1. No evidence of interstitial lung disease.  No findings to  explain the patient's shortness of breath.  2.  Tiny left renal stone.   Assessment/Plan: Encounter Diagnoses  Name Primary?   OSA (obstructive sleep apnea) Yes   Intolerance of continuous positive airway pressure (CPAP) ventilation    BMI 29.0-29.9,adult    Mouth breathing    Chronic nasal congestion    Deviated nasal septum    Hypertrophy of both inferior nasal turbinates    Environmental and seasonal allergies      Assessment and Plan    Obstructive Sleep Apnea Diagnosed in 2019 with initial HST AHI of 30, but more recent in-lab sleep study 02/2023 with AHI of 52.8. CPAP intolerance due to claustrophobia and discomfort. Considering Inspire implant. Discussed procedure, including device placement, drug-induced sleep endoscopy to rule out complete concentric collapse of the soft palate, and potential risks (infection, seroma, hematoma, nerve praxia, lung injury) and benefits (improved sleep apnea management). Advised avoiding strenuous activities for 3-4 weeks post-procedure.  OSA, moderate to severe, without multilevel collapse, with failure to tolerate PAP  therapy and/or more conservative measures. Presence of smaller/absent tonsils (2+) and larger tongue position (Friedman 3-4 tongue position of III suggests that hypopharyngeal/retrolingual collapse is contributing to the patient's OSA. Zachery Conch, M et al. Staging of obstructive sleep apnea/hypopnea syndrome: a guide to appropriate treatment. Laryngoscope, 2004 Mar, 114(3):454-9. PMID: [id number]) Options including positional therapy, weight loss, oral appliances will likely be ineffective due to severity of OSA. PAP therapy and surgical correction discussed. Pt is not an ideal candidate for oral appliance due to  severity of OSA Pt could be a candidate for Hypoglossal nerve stimulation (Inspire therapy) pending DISE results  - Order drug-induced sleep endoscopy  Chronic Nasal Congestion Septal Deviation and Inferior turbinate hypertrophy Chronic nasal congestion with deviated septum/ITH and significant narrowing of nasal passages on nasal endoscopy today. Symptoms include mouth breathing and nasal obstruction, especially when lying down. Previous nasal sprays (Flonase) ineffective. Septal deviation confirmed. Discussed nasal sprays and surgical options. - Prescribed Nasonex 2 puffs b/l nares + Ocean spray to avoid nasal dryness  - if fails medical management will consider Septo/ITR in the future   Environmental Allergies  - Prescribed Nasonex 2 puffs b/l nares + Ocean spray to avoid nasal dryness - Zyrtec 10 mg daily at bedtime  - will consider allergy testing in the future   Rheumatoid Arthritis Diagnosed in 2001, well-controlled with methotrexate. Reports occasional nausea. No recent exacerbations. Discussed potential impact of methotrexate and Enbrel on wound healing and need to consult rheumatologist before surgery. - Continue methotrexate as prescribed - Consult rheumatologist regarding potential need to stop methotrexate and Enbrel before Inspire implant surgery  General Health Maintenance No history of heart disease or stroke. No current use of aspirin or blood thinners. Regular use of melatonin for sleep. - Continue melatonin as needed for sleep  Follow-up - Follow-up with surgery scheduling team for drug-induced sleep endoscopy - Discuss nasal congestion findings and potential septoplasty after endoscopy.     Update 06/22/2023 Assessment and Plan    Obstructive Sleep Apnea (OSA)   He is a candidate for Inspire therapy based on PSG and no evidence of complete concentric collapse at the palate during DISE. Preapproved for the procedure done, he was informed about the surgical process,  including incision locations and post-operative care. Concerns about work-related physical activity post-surgery were addressed, advising avoidance of strenuous activities for four weeks to prevent device dislodgement, which could require complex revision surgery. He talked about the safety of flying and MRI compatibility of the device. Post-operative follow-up will be with the sleep physician for device activation and monitoring 4 weeks post-op. The Inspire device senses breathing and stimulates the tongue to prevent airway obstruction, with settings adjusted post-operatively to ensure efficacy.   - Proceed with Inspire implant surgery as scheduled.   - Provide post-operative recovery instructions via MyChart and printed copy.   - Advised to avoid strenuous activities for four weeks post-surgery to prevent device dislodgement.   - Coordinate with HR for work-related leave and restrictions.   - Provide a note for work restrictions if needed.   - Refer to sleep physician for post-operative follow-up and device activation (Dr Dwyane Luo Sleep).    Deviated Nasal Septum/ITH and chronic nasal congestion    Exam during initial evaluation: Internal Nose: Septum is deviated to the left with caudal spur on the right and significant narrowing of bilateral nasal passages (S- shaped septum). No polyp, or purulence. Mucosal edema and erythema present.  Bilateral inferior turbinate hypertrophy.   He has an S-shaped  septal deviation causing nasal congestion and mouth breathing. Previous trials of nasal sprays and decongestants were unsuccessful, but he plans to retry them post-Inspire surgery. Agreed to retry medical management before considering surgical intervention. Concerned about nasal breathing affecting Inspire device efficacy.   - Prescribed Nasonex 2 puffs b/l nares + Ocean spray to avoid nasal dryness - Zyrtec 10 mg daily at bedtime .    Rheumatoid Arthritis   He is on Enbrel and methotrexate. The  rheumatologist advised stopping Enbrel one week before and after surgery to avoid delayed healing. Plans to discuss methotrexate use with the rheumatologist.   - Hold Enbrel one week before and one week after surgery.   - Consult rheumatologist regarding methotrexate use around the time of surgery.    Follow-up   He will have follow-up appointments for post-operative care and device activation. Plans to see the rheumatologist at the end of the month for routine follow-up and medication management discussion around surgery.   - Schedule post-operative follow-up visit two weeks after surgery.   - Coordinate follow-up with sleep physician for device activation four weeks post-surgery.   - Plan rheumatology follow-up at the end of the month.          [name], MD Otolaryngology [facility] Phone: [phone] Fax: [phone]    06/22/2023, 9:58 AM

## 2023-07-11 ENCOUNTER — Other Ambulatory Visit: Payer: Self-pay

## 2023-07-11 ENCOUNTER — Encounter (HOSPITAL_BASED_OUTPATIENT_CLINIC_OR_DEPARTMENT_OTHER): Payer: Self-pay

## 2023-07-12 ENCOUNTER — Telehealth (INDEPENDENT_AMBULATORY_CARE_PROVIDER_SITE_OTHER): Payer: Self-pay

## 2023-07-12 NOTE — Telephone Encounter (Signed)
 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  He also wanted to know about after he is completely healed if there will be an issue with him exercising, he wants to get back into strength training.

## 2023-07-12 NOTE — Telephone Encounter (Signed)
 I left the patient a message that Dr Irene Pap stated it was fine to shoot and exercise after the 4 week post op healing was completed

## 2023-07-16 NOTE — Anesthesia Preprocedure Evaluation (Signed)
 Anesthesia Evaluation  Patient identified by MRN, date of birth, ID band Patient awake    Reviewed: Allergy & Precautions, NPO status , Patient's Chart, lab work & pertinent test results  History of Anesthesia Complications Negative for: history of anesthetic complications  Airway Mallampati: III  TM Distance: >3 FB Neck ROM: Full    Dental  (+) Dental Advisory Given   Pulmonary neg shortness of breath, sleep apnea , neg COPD, neg recent URI   Pulmonary exam normal breath sounds clear to auscultation       Cardiovascular (-) hypertension(-) angina (-) Past MI, (-) Cardiac Stents and (-) CABG (-) dysrhythmias + Valvular Problems/Murmurs (mild) MR  Rhythm:Regular Rate:Normal  HLD  TTE 07/14/2015: Study Conclusions   - Left ventricle: The cavity size was normal. Systolic function was    normal. The estimated ejection fraction was in the range of 60%    to 65%. Wall motion was normal; there were no regional wall    motion abnormalities. Left ventricular diastolic function    parameters were normal.  - Mitral valve: There was mild regurgitation.     Neuro/Psych negative neurological ROS     GI/Hepatic Neg liver ROS,GERD  ,,  Endo/Other  negative endocrine ROS    Renal/GU negative Renal ROS   BPH    Musculoskeletal  (+) Arthritis , Rheumatoid disorders,  Osteopenia    Abdominal   Peds  Hematology negative hematology ROS (+) Lab Results      Component                Value               Date                      WBC                      8.1                 06/02/2023                HGB                      15.4                06/02/2023                HCT                      46.2                06/02/2023                MCV                      92.8                06/02/2023                PLT                      312                 06/02/2023              Anesthesia Other Findings   Reproductive/Obstetrics  Anesthesia Physical Anesthesia Plan  ASA: 2  Anesthesia Plan: General   Post-op Pain Management: Tylenol PO (pre-op)*   Induction: Intravenous  PONV Risk Score and Plan: 2 and Ondansetron, Dexamethasone and Treatment may vary due to age or medical condition  Airway Management Planned: Oral ETT  Additional Equipment:   Intra-op Plan:   Post-operative Plan: Extubation in OR  Informed Consent: I have reviewed the patients History and Physical, chart, labs and discussed the procedure including the risks, benefits and alternatives for the proposed anesthesia with the patient or authorized representative who has indicated his/her understanding and acceptance.     Dental advisory given  Plan Discussed with: CRNA and Anesthesiologist  Anesthesia Plan Comments: (Risks of general anesthesia discussed including, but not limited to, sore throat, hoarse voice, chipped/damaged teeth, injury to vocal cords, nausea and vomiting, allergic reactions, lung infection, heart attack, stroke, and death. All questions answered. )        Anesthesia Quick Evaluation

## 2023-07-18 ENCOUNTER — Other Ambulatory Visit: Payer: Self-pay

## 2023-07-18 ENCOUNTER — Ambulatory Visit (HOSPITAL_BASED_OUTPATIENT_CLINIC_OR_DEPARTMENT_OTHER): Admitting: Anesthesiology

## 2023-07-18 ENCOUNTER — Ambulatory Visit (HOSPITAL_COMMUNITY)

## 2023-07-18 ENCOUNTER — Encounter (HOSPITAL_BASED_OUTPATIENT_CLINIC_OR_DEPARTMENT_OTHER): Payer: Self-pay

## 2023-07-18 ENCOUNTER — Encounter (HOSPITAL_BASED_OUTPATIENT_CLINIC_OR_DEPARTMENT_OTHER)
Admission: RE | Disposition: A | Discharge status: Home / Self Care | Payer: Self-pay | Source: Home / Self Care | Attending: Otolaryngology

## 2023-07-18 ENCOUNTER — Ambulatory Visit (HOSPITAL_BASED_OUTPATIENT_CLINIC_OR_DEPARTMENT_OTHER)
Admission: RE | Admit: 2023-07-18 | Discharge: 2023-07-18 | Disposition: A | Discharge status: Home / Self Care | Attending: Otolaryngology | Admitting: Otolaryngology

## 2023-07-18 DIAGNOSIS — M069 Rheumatoid arthritis, unspecified: Secondary | ICD-10-CM | POA: Diagnosis not present

## 2023-07-18 DIAGNOSIS — F40240 Claustrophobia: Secondary | ICD-10-CM | POA: Diagnosis not present

## 2023-07-18 DIAGNOSIS — J342 Deviated nasal septum: Secondary | ICD-10-CM | POA: Diagnosis not present

## 2023-07-18 DIAGNOSIS — Z91198 Patient's noncompliance with other medical treatment and regimen for other reason: Secondary | ICD-10-CM | POA: Diagnosis not present

## 2023-07-18 DIAGNOSIS — G4733 Obstructive sleep apnea (adult) (pediatric): Secondary | ICD-10-CM | POA: Diagnosis present

## 2023-07-18 DIAGNOSIS — Z6828 Body mass index (BMI) 28.0-28.9, adult: Secondary | ICD-10-CM

## 2023-07-18 DIAGNOSIS — K219 Gastro-esophageal reflux disease without esophagitis: Secondary | ICD-10-CM | POA: Insufficient documentation

## 2023-07-18 DIAGNOSIS — J343 Hypertrophy of nasal turbinates: Secondary | ICD-10-CM | POA: Insufficient documentation

## 2023-07-18 HISTORY — PX: IMPLANTATION OF HYPOGLOSSAL NERVE STIMULATOR: SHX6827

## 2023-07-18 LAB — GLUCOSE, CAPILLARY: Glucose-Capillary: 125 mg/dL — ABNORMAL HIGH (ref 70–99)

## 2023-07-18 SURGERY — INSERTION, HYPOGLOSSAL NERVE STIMULATOR
Anesthesia: General

## 2023-07-18 MED ORDER — OXYCODONE HCL 5 MG PO TABS: 5.0000 mg | ORAL_TABLET | ORAL | 0 refills | Status: AC | PRN

## 2023-07-18 MED ORDER — OXYCODONE HCL 5 MG/5ML PO SOLN: 5.0000 mg | Freq: Once | ORAL | Status: DC | PRN

## 2023-07-18 MED ORDER — LIDOCAINE-EPINEPHRINE 1 %-1:100000 IJ SOLN
INTRAMUSCULAR | Status: AC
  Filled 2023-07-18: qty 1

## 2023-07-18 MED ORDER — DEXMEDETOMIDINE HCL IN NACL 80 MCG/20ML IV SOLN
INTRAVENOUS | Status: DC | PRN
  Administered 2023-07-18: 12 ug via INTRAVENOUS
  Administered 2023-07-18: 8 ug via INTRAVENOUS

## 2023-07-18 MED ORDER — OXYCODONE HCL 5 MG PO TABS: 5.0000 mg | ORAL_TABLET | Freq: Once | ORAL | Status: DC | PRN

## 2023-07-18 MED ORDER — ONDANSETRON HCL 4 MG/2ML IJ SOLN
INTRAMUSCULAR | Status: DC | PRN
Start: 2023-07-18 — End: 2023-07-18
  Administered 2023-07-18: 4 mg via INTRAVENOUS

## 2023-07-18 MED ORDER — LACTATED RINGERS IV SOLN: INTRAVENOUS | Status: DC

## 2023-07-18 MED ORDER — SUCCINYLCHOLINE CHLORIDE 200 MG/10ML IV SOSY
PREFILLED_SYRINGE | INTRAVENOUS | Status: DC | PRN
  Administered 2023-07-18: 140 mg via INTRAVENOUS

## 2023-07-18 MED ORDER — LIDOCAINE-EPINEPHRINE 1 %-1:100000 IJ SOLN
INTRAMUSCULAR | Status: DC | PRN
  Administered 2023-07-18: 8 mL

## 2023-07-18 MED ORDER — AMISULPRIDE (ANTIEMETIC) 5 MG/2ML IV SOLN: 10.0000 mg | Freq: Once | INTRAVENOUS | Status: DC | PRN

## 2023-07-18 MED ORDER — FENTANYL CITRATE (PF) 100 MCG/2ML IJ SOLN
INTRAMUSCULAR | Status: DC | PRN
  Administered 2023-07-18: 100 ug via INTRAVENOUS

## 2023-07-18 MED ORDER — ACETAMINOPHEN ER 650 MG PO TBCR: 650.0000 mg | EXTENDED_RELEASE_TABLET | Freq: Three times a day (TID) | ORAL | 1 refills | Status: AC

## 2023-07-18 MED ORDER — ACETAMINOPHEN 500 MG PO TABS
1000.0000 mg | ORAL_TABLET | Freq: Once | ORAL | Status: AC
  Administered 2023-07-18: 1000 mg via ORAL

## 2023-07-18 MED ORDER — ACETAMINOPHEN 500 MG PO TABS
ORAL_TABLET | ORAL | Status: AC
  Filled 2023-07-18: qty 2

## 2023-07-18 MED ORDER — PHENYLEPHRINE HCL-NACL 20-0.9 MG/250ML-% IV SOLN
INTRAVENOUS | Status: DC | PRN
Start: 2023-07-18 — End: 2023-07-18
  Administered 2023-07-18: 50 ug/min via INTRAVENOUS

## 2023-07-18 MED ORDER — SENNA 8.6 MG PO TABS: 1.0000 | ORAL_TABLET | Freq: Every day | ORAL | 0 refills | Status: AC | PRN

## 2023-07-18 MED ORDER — CLINDAMYCIN PHOSPHATE 900 MG/50ML IV SOLN
900.0000 mg | Freq: Once | INTRAVENOUS | Status: AC
  Administered 2023-07-18: 900 mg via INTRAVENOUS

## 2023-07-18 MED ORDER — CLINDAMYCIN PHOSPHATE 900 MG/50ML IV SOLN
INTRAVENOUS | Status: AC
  Filled 2023-07-18: qty 50

## 2023-07-18 MED ORDER — DOXYCYCLINE HYCLATE 100 MG PO TABS: 100.0000 mg | ORAL_TABLET | Freq: Two times a day (BID) | ORAL | 0 refills | Status: AC

## 2023-07-18 MED ORDER — FENTANYL CITRATE (PF) 100 MCG/2ML IJ SOLN: 25.0000 ug | INTRAMUSCULAR | Status: DC | PRN

## 2023-07-18 MED ORDER — LIDOCAINE HCL (CARDIAC) PF 100 MG/5ML IV SOSY
PREFILLED_SYRINGE | INTRAVENOUS | Status: DC | PRN
  Administered 2023-07-18: 100 mg via INTRATRACHEAL

## 2023-07-18 MED ORDER — PROPOFOL 500 MG/50ML IV EMUL
INTRAVENOUS | Status: DC | PRN
  Administered 2023-07-18: 75 ug/kg/min via INTRAVENOUS
  Administered 2023-07-18: 200 mg via INTRAVENOUS
  Administered 2023-07-18: 100 ug/kg/min via INTRAVENOUS
  Administered 2023-07-18: 50 mg via INTRAVENOUS
  Administered 2023-07-18: 40 mg via INTRAVENOUS

## 2023-07-18 MED ORDER — SODIUM CHLORIDE 0.9 % IV SOLN
INTRAVENOUS | Status: DC | PRN
  Administered 2023-07-18: 500 mL

## 2023-07-18 MED ORDER — SODIUM CHLORIDE 0.9 % IV SOLN
INTRAVENOUS | Status: AC
  Filled 2023-07-18: qty 10

## 2023-07-18 MED ORDER — DEXAMETHASONE SODIUM PHOSPHATE 10 MG/ML IJ SOLN
INTRAMUSCULAR | Status: DC | PRN
  Administered 2023-07-18: 5 mg via INTRAVENOUS

## 2023-07-18 SURGICAL SUPPLY — 61 items
BAG DECANTER FOR FLEXI CONT (MISCELLANEOUS) ×1 IMPLANT
BLADE CLIPPER SURG (BLADE) IMPLANT
BLADE SURG 15 STRL LF DISP TIS (BLADE) ×1 IMPLANT
CANISTER SUCT 1200ML W/VALVE (MISCELLANEOUS) ×1 IMPLANT
CORD BIPOLAR FORCEPS 12FT (ELECTRODE) ×1 IMPLANT
COVER PROBE CYLINDRICAL 5X96 (MISCELLANEOUS) ×1 IMPLANT
DERMABOND ADVANCED .7 DNX12 (GAUZE/BANDAGES/DRESSINGS) ×2 IMPLANT
DRAPE C-ARM 35X43 STRL (DRAPES) ×1 IMPLANT
DRAPE HEAD BAR (DRAPES) IMPLANT
DRAPE INCISE IOBAN 66X45 STRL (DRAPES) ×1 IMPLANT
DRAPE MICROSCOPE WILD 40.5X102 (DRAPES) ×1 IMPLANT
DRAPE UTILITY XL STRL (DRAPES) IMPLANT
DRSG TEGADERM 2-3/8X2-3/4 SM (GAUZE/BANDAGES/DRESSINGS) ×2 IMPLANT
ELECT COATED BLADE 2.86 ST (ELECTRODE) ×1 IMPLANT
ELECT EMG 18 NIMS (NEUROSURGERY SUPPLIES) ×1 IMPLANT
ELECT REM PT RETURN 9FT ADLT (ELECTROSURGICAL) ×1 IMPLANT
ELECTRODE EMG 18 NIMS (NEUROSURGERY SUPPLIES) ×1 IMPLANT
ELECTRODE REM PT RTRN 9FT ADLT (ELECTROSURGICAL) ×1 IMPLANT
FORCEPS BIPOLAR SPETZLER 8 1.0 (NEUROSURGERY SUPPLIES) ×1 IMPLANT
GAUZE 4X4 16PLY ~~LOC~~+RFID DBL (SPONGE) ×1 IMPLANT
GAUZE SPONGE 4X4 12PLY STRL (GAUZE/BANDAGES/DRESSINGS) ×1 IMPLANT
GENERATOR PULSE INSPIRE (Generator) ×1 IMPLANT
GENERATOR PULSE INSPIRE IV (Generator) ×1 IMPLANT
GLOVE BIO SURGEON STRL SZ 6 (GLOVE) ×1 IMPLANT
GLOVE BIO SURGEON STRL SZ7.5 (GLOVE) ×1 IMPLANT
GOWN STRL REUS W/ TWL LRG LVL3 (GOWN DISPOSABLE) ×3 IMPLANT
IV CATH 18G SAFETY (IV SOLUTION) ×1 IMPLANT
KIT NEURO ACCESSORY W/WRENCH (MISCELLANEOUS) IMPLANT
LEAD SENSING RESP INSPIRE (Lead) ×1 IMPLANT
LEAD SENSING RESP INSPIRE IV (Lead) ×1 IMPLANT
LEAD SLEEP STIM INSPIRE IV/V (Lead) ×1 IMPLANT
LEAD SLEEP STIMULATION INSPIRE (Lead) ×1 IMPLANT
LOOP VASCLR MAXI BLUE 18IN ST (MISCELLANEOUS) ×1 IMPLANT
LOOP VESSEL MINI RED (MISCELLANEOUS) ×1 IMPLANT
LOOPS VASCLR MAXI BLUE 18IN ST (MISCELLANEOUS) ×1 IMPLANT
MARKER SKIN DUAL TIP RULER LAB (MISCELLANEOUS) ×1 IMPLANT
NDL HYPO 25X1 1.5 SAFETY (NEEDLE) ×1 IMPLANT
NEEDLE HYPO 25X1 1.5 SAFETY (NEEDLE) ×1 IMPLANT
NS IRRIG 1000ML POUR BTL (IV SOLUTION) ×1 IMPLANT
PACK BASIN DAY SURGERY FS (CUSTOM PROCEDURE TRAY) ×1 IMPLANT
PACK ENT DAY SURGERY (CUSTOM PROCEDURE TRAY) ×1 IMPLANT
PASSER CATH 36 CODMAN DISP (NEUROSURGERY SUPPLIES) IMPLANT
PASSER CATH 38CM DISP (INSTRUMENTS) IMPLANT
PENCIL SMOKE EVACUATOR (MISCELLANEOUS) ×1 IMPLANT
PROBE NERVE STIMULATOR (NEUROSURGERY SUPPLIES) ×1 IMPLANT
REMOTE CONTROL SLEEP INSPIRE (MISCELLANEOUS) ×1 IMPLANT
SET WALTER ACTIVATION W/DRAPE (SET/KITS/TRAYS/PACK) ×1 IMPLANT
SHEARS HARMONIC 9CM CVD (BLADE) ×1 IMPLANT
SLEEVE SCD COMPRESS KNEE MED (STOCKING) ×1 IMPLANT
SPONGE INTESTINAL PEANUT (DISPOSABLE) ×2 IMPLANT
SUT MON AB 4-0 PS1 27 (SUTURE) ×2 IMPLANT
SUT SILK 2 0 SH (SUTURE) ×1 IMPLANT
SUT SILK 3 0 RB1 (SUTURE) ×1 IMPLANT
SUT SILK 3 0 REEL (SUTURE) IMPLANT
SUT SILK 3 0 SH 30 (SUTURE) IMPLANT
SUT VIC AB 3-0 SH 27X BRD (SUTURE) ×2 IMPLANT
SYR 10ML LL (SYRINGE) ×1 IMPLANT
SYR BULB EAR ULCER 3OZ GRN STR (SYRINGE) ×1 IMPLANT
TAPE CLOTH 3X10 WHT NS LF (GAUZE/BANDAGES/DRESSINGS) ×1 IMPLANT
TIE VASCULAR MAXI BLUE 18IN ST (MISCELLANEOUS) ×1 IMPLANT
TOWEL GREEN STERILE FF (TOWEL DISPOSABLE) ×2 IMPLANT

## 2023-07-18 NOTE — Discharge Instructions (Addendum)
 INSPIRE POST-OPERATIVE INSTRUCTIONS:   Please review post-operative and recovery instructions below that you will need to be aware of after Inspire Implant surgery.  Please restart all of your home medications if you take anything on a daily basis.  You can resume regular diet after this procedure.  You will be scheduled for an appointment with Dr. Irene Pap to review details about surgery and to discuss the next steps.   DIET: Resume normal diet HYGIENE: Please wait until 48 hours after surgery before getting incisions on neck, chest, and torso wet. In the first 48 hours after surgery, will likely need to take sponge baths. WOUND CARE: Please leave pressure dressing on for 48 hours after surgery. Gently place antibiotic ointment over incisions 2 times per day; use clean q-tip. May place a clean bandage over incisions as needed. After 48 hours, you may get incisions wet with warm soap and water, but do not soak the incisions.  Pat area dry gently.  Immediately place antibiotic ointment. Take oral antibiotics as prescribed If skin around incision starts to get red (> 1cm), swollen, and/or more painful, please call the office ACTIVITY: Try to avoid sleeping on the side of your surgery, to the extent possible.   You may walk for exercise starting the day after surgery. For 2 weeks: Do not pick up anything greater than 5 pounds with the hand/arm that's on the same side as the surgery.  After 2 weeks, you may increase weight to 10 pounds.   Consider performing neck rolls 10 clockwise and 10 counterclockwise 3x/day. For 4 weeks, no strenuous activity (running, jogging, lifting weights, gardening, sports) or until cleared by physician.   PAIN MEDICATIONS: You will be prescribed Oxycodone for pain.   If pain is not severe, consider taking Tylenol 650mg  every 6 hours Avoid aspirin for 7 days after surgery Avoid direct heat (such as heating pads) to incision sites.   May gently place ice over  surgery sites as needed.  Please place a thin clean towel over skin first and then place ice bag over towel.  Ice for 10 minutes at a time only.  POST-OPERATIVE CLINIC APPOINTMENTS: 1 week: suture removal and wound check in the office.  1 month: device activation and wound check in the office. 2.5 months: check in visit to assess usage. 3-4 months: titration sleep study based on usage of >4 hrs/night.  4 months: final wound check in the office.  Yearly: device check at office.  SCAR CARE: After incisions have healed, you will have a scar, which will continue to evolve over the course of 12 months.  Caring for your incision scars will help them to be as minimal as possible. If you are out in the sun with incision exposure, please remember to place sunscreen over the incision and surrounding skin.   You may use vitamin E or "Scar ointment/cream" to help soften scar.  Please wait one month after surgery before starting this.     Post Anesthesia Home Care Instructions  Activity: Get plenty of rest for the remainder of the day. A responsible individual must stay with you for 24 hours following the procedure.  For the next 24 hours, DO NOT: -Drive a car -Advertising copywriter -Drink alcoholic beverages -Take any medication unless instructed by your physician -Make any legal decisions or sign important papers.  Meals: Start with liquid foods such as gelatin or soup. Progress to regular foods as tolerated. Avoid greasy, spicy, heavy foods. If nausea and/or vomiting occur, drink  only clear liquids until the nausea and/or vomiting subsides. Call your physician if vomiting continues.  Special Instructions/Symptoms: Your throat may feel dry or sore from the anesthesia or the breathing tube placed in your throat during surgery. If this causes discomfort, gargle with warm salt water. The discomfort should disappear within 24 hours.  If you had a scopolamine patch placed behind your ear for the  management of post- operative nausea and/or vomiting:  1. The medication in the patch is effective for 72 hours, after which it should be removed.  Wrap patch in a tissue and discard in the trash. Wash hands thoroughly with soap and water. 2. You may remove the patch earlier than 72 hours if you experience unpleasant side effects which may include dry mouth, dizziness or visual disturbances. 3. Avoid touching the patch. Wash your hands with soap and water after contact with the patch.    *May have Tylenol today at 2pm*

## 2023-07-18 NOTE — Transfer of Care (Signed)
 Immediate Anesthesia Transfer of Care Note  Patient: Gregory [name]  Procedure(s) Performed: INSERTION, HYPOGLOSSAL NERVE STIMULATOR  Patient Location: PACU  Anesthesia Type:General  Level of Consciousness: awake, alert , and patient cooperative  Airway & Oxygen Therapy: Patient Spontanous Breathing and Patient connected to face mask oxygen  Post-op Assessment: Report given to RN and Post -op Vital signs reviewed and stable  Post vital signs: Reviewed and stable  Last Vitals:  Vitals Value Taken Time  BP 115/68 07/18/23 1206  Temp    Pulse 83 07/18/23 1207  Resp 23 07/18/23 1207  SpO2 92 % 07/18/23 1207  Vitals shown include unfiled device data.  Last Pain:  Vitals:   07/18/23 0754  TempSrc: Temporal  PainSc: 0-No pain      Patients Stated Pain Goal: 1 (07/18/23 0754)  Complications: No notable events documented.

## 2023-07-18 NOTE — Op Note (Signed)
 Operative Report                                                            SURGEON: [name], MD  ASSISTANT: Eyvonne Mechanic, PA-C  PREOPERATIVE DIAGNOSIS: Obstructive sleep apnea. BMI 28.8 CPAP intolerance  POSTOPERATIVE DIAGNOSIS: Obstructive sleep apnea. BMI 28.8 CPAP intolerance  ANESTHESIA: General endotracheal.  ESTIMATED BLOOD LOSS: Less than 15 ml.  SPECIMENS: None.  DRAINS: None.  COMPLICATIONS: None.  Procedure: 12th cranial nerve (hypoglossal) stimulation implant along with placement of right pleural respiration sensor.  Electronic analysis of the implanted neurostimulator pulse generator system  Pre-Op Diagnosis: Moderate /Severe obstructive sleep apnea with positive airway pressure intolerance (ICD-10 G47.33). Post-Op Diagnosis: Moderate /Severe obstructive sleep apnea with positive pressure airway intolerance (ICD-10 G47.33).   Anesthesia: General endotracheal  Complications: None Brief Clinical History:     This is a 53 year old patient with a history of moderate /severe obstructive sleep apnea, who is intolerant and unable to achieve benefit from positive pressure therapy. Patient has passed the clinical, polysomnographic, and endoscopic screening criteria and presents today for the implant.    Procedure Description: The patient was brought to the Operating Room and was anesthetized via general endotracheal anesthesia without complication.  A shoulder roll was placed and the patient was prepped and draped in usual sterile fashion with the head turned to the left. Prior to prepping and draping, electrodes were placed in the genioglossus and styloglossus muscle and connected to the NIM box for intraoperative nerve monitoring.  A submental incision was made in the right upper neck approximately 2 cm below the mandible in the natural skin crease. Dissection was carried down through the subcutaneous tissue and platysma. The inferior border of the  submandibular gland was identified as well as the digastric tendon. The submandibular gland and the overlying fascia with the marginal mandibular nerve were retracted superiorly.  The digastric was retracted inferiorly.  Dissection was carried down into the digastric triangle where the hypoglossal nerve was identified in its usual fashion.   The mylohyoid muscle was retracted anteriorally, and the hypoglossal nerve was dissected up towards the floor of the mouth.  The lateral branches to retrusor muscles were identified, and tested intra-operatively using the NIM stimulator.  The cuff electrode for the hypoglossal nerve stimulator was placed distally to these branches on the medial nerve branch to the genioglossus muscle. Diagnostic evaluation confirmed activation of the genioglossus nerve, resulting in genioglossal activation and tongue protrusion, confirmed both visually and on NIM monitor. The stimulation electrode was then secured to the digastric tendon on its lateral surface with the provided anchor.  A second 5 cm incision was made in the right upper chest approximately 3 cm below the clavicle.  Dissection was carried down to the pectoralis muscle. An inferior pocket was created deep to the subcutaneous layer and superficial to the pectoralis major muscle.  In the upper portion of our chest pocket, pectoralis muscle fibers were then divided until we encountered anterior chest wall. External intercostal muscle was visualized and dissected until internal intercostal was visualized. Our respiratory sense lead was then tunneled in the fascial plane between external and internal intercostals. The distal anchor was secured to the anterior chest wall using 3-2.0 silk sutures and the proximal anchor was secured to the pec major  facscia.  The stimulation lead was then tunneled in a subplatysmal plane and brought out into the sub-clavicular pocket.  Pulse generator was then brought onto the field and the sense and  stimulation pins were both connected to the appropriate headers and secured using a torque screwdriver. The implantable pulse generator was placed in the subclavicular pocket and secured loosely to the pectoralis fascia using non-resorbable sutures.    Diagnostic evaluation of the system was run, confirming good respiratory wave from the sensing lead, good anterior tongue protrusion with stimulation, and normal impedence measurements.  All the wounds were thoroughly irrigated with bacitracin irrigation.  The wounds were then closed in multiple layers with deep Vicryl sutures and a 5-0 biosyn.  Dermabond was then applied to the skin.    The patient was then awakened, extubated, and transferred to Recovery Room in stable condition. All counts correct x2.  I was present for and performed the entire procedure.  The advanced practice practitioner (APP) assisted throughout the case. The APP was essential in retraction and counter traction when needed to make the case progress smoothly. This retraction and assistance made it possible to see the tissue planes for the procedure. The assistance was needed for blood control, tissue re-approximation and assisted with closure of the incision site

## 2023-07-18 NOTE — Interval H&P Note (Signed)
 History and Physical Interval Note:  07/18/2023 8:48 AM  Gregory [name]  has presented today for surgery, with the diagnosis of Obstructive sleep apnea.  The various methods of treatment have been discussed with the patient and family. After consideration of risks, benefits and other options for treatment, the patient has consented to  Procedure(s): INSERTION, HYPOGLOSSAL NERVE STIMULATOR (N/A) as a surgical intervention.  The patient's history has been reviewed, patient examined, no change in status, stable for surgery.  I have reviewed the patient's chart and labs.  Questions were answered to the patient's satisfaction.     [name]

## 2023-07-18 NOTE — Addendum Note (Signed)
 Addendum  created 07/18/23 1412 by [name], MD   Intraprocedure Event edited

## 2023-07-18 NOTE — Anesthesia Postprocedure Evaluation (Signed)
 Anesthesia Post Note  Patient: Gregory [name]  Procedure(s) Performed: INSERTION, HYPOGLOSSAL NERVE STIMULATOR     Patient location during evaluation: PACU Anesthesia Type: General Level of consciousness: awake Pain management: pain level controlled Vital Signs Assessment: post-procedure vital signs reviewed and stable Respiratory status: spontaneous breathing, nonlabored ventilation and respiratory function stable Cardiovascular status: blood pressure returned to baseline and stable Postop Assessment: no apparent nausea or vomiting Anesthetic complications: no   No notable events documented.  Last Vitals:  Vitals:   07/18/23 1245 07/18/23 1300  BP: (!) 56/48 103/71  Pulse: 77 75  Resp: (!) 0 10  Temp:    SpO2: 98% 97%    Last Pain:  Vitals:   07/18/23 1300  TempSrc:   PainSc: Asleep                 [name]

## 2023-07-18 NOTE — Anesthesia Procedure Notes (Addendum)
 Procedure Name: Intubation Date/Time: 07/18/2023 9:16 AM  Performed by: Karen Kitchens, CRNAPre-anesthesia Checklist: Patient identified, Emergency Drugs available, Suction available and Patient being monitored Patient Re-evaluated:Patient Re-evaluated prior to induction Oxygen Delivery Method: Circle system utilized Preoxygenation: Pre-oxygenation with 100% oxygen Induction Type: IV induction Ventilation: Mask ventilation without difficulty Laryngoscope Size: Glidescope and 3 Grade View: Grade I Tube type: Oral Rae Tube size: 7.0 mm Number of attempts: 1 Airway Equipment and Method: Stylet and Oral airway Placement Confirmation: ETT inserted through vocal cords under direct vision, positive ETCO2, breath sounds checked- equal and bilateral and CO2 detector Secured at: 24 cm Tube secured with: Tape Dental Injury: Teeth and Oropharynx as per pre-operative assessment  Comments: Atraumatic intubation with clear view of vocal cords. [email or web address]

## 2023-07-20 ENCOUNTER — Encounter (HOSPITAL_BASED_OUTPATIENT_CLINIC_OR_DEPARTMENT_OTHER): Payer: Self-pay | Admitting: Otolaryngology

## 2023-07-26 ENCOUNTER — Encounter (INDEPENDENT_AMBULATORY_CARE_PROVIDER_SITE_OTHER): Payer: Self-pay | Admitting: Otolaryngology

## 2023-07-26 ENCOUNTER — Encounter: Payer: Self-pay | Admitting: Otolaryngology

## 2023-07-26 ENCOUNTER — Ambulatory Visit (INDEPENDENT_AMBULATORY_CARE_PROVIDER_SITE_OTHER): Admitting: Otolaryngology

## 2023-07-26 VITALS — BP 130/81 | HR 77

## 2023-07-26 DIAGNOSIS — R0981 Nasal congestion: Secondary | ICD-10-CM

## 2023-07-26 DIAGNOSIS — R065 Mouth breathing: Secondary | ICD-10-CM

## 2023-07-26 DIAGNOSIS — G4733 Obstructive sleep apnea (adult) (pediatric): Secondary | ICD-10-CM

## 2023-07-26 DIAGNOSIS — Z789 Other specified health status: Secondary | ICD-10-CM

## 2023-07-26 DIAGNOSIS — Z9889 Other specified postprocedural states: Secondary | ICD-10-CM

## 2023-07-26 DIAGNOSIS — Z6829 Body mass index (BMI) 29.0-29.9, adult: Secondary | ICD-10-CM

## 2023-07-26 DIAGNOSIS — J342 Deviated nasal septum: Secondary | ICD-10-CM

## 2023-07-26 DIAGNOSIS — J343 Hypertrophy of nasal turbinates: Secondary | ICD-10-CM

## 2023-07-26 DIAGNOSIS — J3089 Other allergic rhinitis: Secondary | ICD-10-CM

## 2023-07-26 NOTE — Progress Notes (Signed)
 ENT Progress Note:   Update 07/26/2023  Discussed the use of AI scribe software for clinical note transcription with the patient, who gave verbal consent to proceed.  History of Present Illness Gregory [name] is a 53 year old male who presents for a postoperative follow-up 1 week after [facility] Implant surgery.  He experienced a prolonged awakening from anesthesia, taking approximately two to three hours. He felt 'foggy headed' after the procedure and did not feel normal until the weekend following the surgery. For pain management, he only took oxy once and managed with Tylenol thereafter.  Postoperatively, he experienced a sore throat for two to three days, which he attributes to intubation during surgery.   He is currently consuming soft foods to avoid discomfort in his tongue and throat. He occasionally experiences pain in his tongue depending on the food he eats. He has nasal obstruction, feeling like he has a persistent problem with nasal breathing, which has led him to become a mouth breather. He is currently not using Nasonex or Zyrtec but is considering starting them to help with nasal congestion.  He is contemplating returning to work and is concerned about his ability to perform his duties, which involve driving and potentially strenuous activities. He is considering light duty work and is awaiting clearance from his employer's medical services department.  S/p Inspire Implant surgery 07/18/23  Records Reviewed:  Initial Evaluation   Update 07/26/2023  Discussed the use of AI scribe software for clinical note transcription with the patient, who gave verbal consent to proceed.  History of Present Illness   The patient presents with questions regarding the upcoming Inspire implant surgery.  He is scheduled for an upcoming Inspire implant surgery 07/18/23 and has several questions regarding the procedure and recovery process. He is concerned about coordinating his work schedule and  understanding post-operative restrictions.   He is concerned about the impact of his current medications on the surgery. He is taking Enbrel and methotrexate for arthritis and plans to discuss this further with his rheumatologist at an upcoming appointment.  He has a history of chronic nasal congestion, deviated septum, allergies and has previously tried nasal sprays and decongestants without success. He plans to revisit this issue after the [facility] surgery, as he is concerned about potential nasal breathing difficulties post-implantation.  He is managing family responsibilities under FMLA due to his mother's care needs following his father's passing [name] year.      Records Reviewed:  Initial Evaluation  Reason for Consult: OSA CPAP intolerance  Referring provider: Dr Gregory [name] Sleep     HPI: Discussed the use of AI scribe software for clinical note transcription with the patient, who gave verbal consent to proceed.  History of Present Illness   Gregory [name] is a 53 year old male with hx of sleep apnea who presents with CPAP intolerance and nasal breathing issues. He was referred by Dr. Ermalinda [name] for evaluation of sleep apnea and potential Inspire Implant.  He has a history of sleep apnea diagnosed in 2019 and has been intolerant of CPAP therapy. He attempted using a full face mask for six to nine months but experienced claustrophobia, waking up with the mask off, and difficulty returning to sleep due to air pressure. He discontinued CPAP use in 2019.  He is a mouth breather with a history of chronic nasal congestion and a possible deviated septum. He has worsening of baseline nasal congestion when lying down and occasionally wakes up with one side completely closed. Nasal sprays  like Flonase have been ineffective. No prior nasal or sinus procedures.   He has rheumatoid arthritis, diagnosed around 2001, and has been on methotrexate with some nausea as a side effect. His condition has  been controlled since 2005-2006. He reports swelling and pain in his hands, wrists, knees, and ankles, but no significant issues currently. Had poor healing in the past when on Enbrel, and stopped it due to poor healing after a leg injury. Currently on methotrexate.   No personal history of heart disease or stroke, although his father had a stroke [name] year. He uses melatonin almost daily to aid sleep and has no diagnosis of insomnia.      Records Reviewed:  [facility] Sleep Office Visit Dr Gregory [name] 01/02/23 and 11/30/22 52 yoM hx of RA, GERD, BPH, OSA, AHI of 32 on HST 07/2017.  Sleep schedule 10-10:30 pm to 6:30 am. Wakes 1-3 times a night. Interested in [place]. Had a trial of CPAP in 2019 and did not tolerate it.     Past Medical History:  Diagnosis Date   Arthritis    BPH (benign prostatic hyperplasia)    Deviated septum    Dyspnea    GERD (gastroesophageal reflux disease)    Hypercholesterolemia    Osteopenia    Rheumatoid arthritis(714.0)    Sleep apnea    Tibia and fibula open fracture, right     Past Surgical History:  Procedure Laterality Date   DRUG INDUCED ENDOSCOPY N/A 06/02/2023   Procedure: DRUG INDUCED ENDOSCOPY;  Surgeon: Gregory Last, MD;  Location: [facility] OR;  Service: ENT;  Laterality: N/A;   IMPLANTATION OF HYPOGLOSSAL NERVE STIMULATOR N/A 07/18/2023   Procedure: INSERTION, HYPOGLOSSAL NERVE STIMULATOR;  Surgeon: Gregory Last, MD;  Location: [facility];  Service: ENT;  Laterality: N/A;   KNEE ARTHROSCOPY W/ MENISCECTOMY Right    WRIST ARTHROSCOPY Left 2004    Family History  Problem Relation Age of Onset   Hypertension Father    Atrial fibrillation Father    Hyperlipidemia Mother    Heart attack Paternal Grandfather    CAD Cousin    Heart disease Paternal Aunt    Colon cancer Neg Hx    Colon polyps Neg Hx    Esophageal cancer Neg Hx    Stomach cancer Neg Hx    Rectal cancer Neg Hx     Social History:  reports that he has never smoked. He  has never used smokeless tobacco. He reports that he does not drink alcohol and does not use drugs.  Allergies:  Allergies  Allergen Reactions   Amoxicillin Rash   Pravastatin Palpitations   Rosuvastatin Palpitations   Simvastatin Palpitations    Medications: I have reviewed the patient's current medications.  The PMH, PSH, Medications, Allergies, and SH were reviewed and updated.  ROS: Constitutional: Negative for fever, weight loss and weight gain. Cardiovascular: Negative for chest pain and dyspnea on exertion. Respiratory: Is not experiencing shortness of breath at rest. Gastrointestinal: Negative for nausea and vomiting. Neurological: Negative for headaches. Psychiatric: The patient is not nervous/anxious  Blood pressure 130/81, pulse 77, SpO2 99%.  PHYSICAL EXAM:  Exam: General: Well-developed, well-nourished Respiratory Respiratory effort: Equal inspiration and expiration without stridor Cardiovascular Peripheral Vascular: Warm extremities with equal color/perfusion Eyes: No nystagmus with equal extraocular motion bilaterally Neuro/Psych/Balance: Patient oriented to person, place, and time; Appropriate mood and affect; Gait is intact with no imbalance; Cranial nerves I-XII are intact Head and Face Inspection: Normocephalic and atraumatic without mass or lesion Right  submental and chest wall incisions c/d/I/ no fluctuance, no ecchymosis or swelling. Facial Strength: Facial motility symmetric and full bilaterally ENT Pinna: External ear intact and fully developed External canal: Canal is patent with intact skin Tympanic Membrane: Clear and mobile External Nose: No scar or anatomic deformity    Studies Reviewed: NPSG in-lab sleep study 02/14/23 BMI 29 AHI of 52.8 (24 obstructive, 4 mixed and 6 central events and 330 hypopneas) ~ 2.7 % of central/mixed events    CT chest 2013 IMPRESSION:   1. No evidence of interstitial lung disease.  No findings to  explain  the patient's shortness of breath.  2.  Tiny left renal stone.   Assessment/Plan: Encounter Diagnoses  Name Primary?   Hypertrophy of both inferior nasal turbinates Yes   Mouth breathing    Chronic nasal congestion    Environmental and seasonal allergies    Intolerance of continuous positive airway pressure (CPAP) ventilation    BMI 29.0-29.9,adult    Deviated nasal septum    OSA (obstructive sleep apnea)       Assessment and Plan    Obstructive Sleep Apnea Diagnosed in 2019 with initial HST AHI of 30, but more recent in-lab sleep study 02/2023 with AHI of 52.8. CPAP intolerance due to claustrophobia and discomfort. Considering Inspire implant. Discussed procedure, including device placement, drug-induced sleep endoscopy to rule out complete concentric collapse of the soft palate, and potential risks (infection, seroma, hematoma, nerve praxia, lung injury) and benefits (improved sleep apnea management). Advised avoiding strenuous activities for 3-4 weeks post-procedure.  OSA, moderate to severe, without multilevel collapse, with failure to tolerate PAP therapy and/or more conservative measures. Presence of smaller/absent tonsils (2+) and larger tongue position (Friedman 3-4 tongue position of III suggests that hypopharyngeal/retrolingual collapse is contributing to the patient's OSA. Zachery Conch, M et al. Staging of obstructive sleep apnea/hypopnea syndrome: a guide to appropriate treatment. Laryngoscope, 2004 Mar, 114(3):454-9. PMID: [id number]) Options including positional therapy, weight loss, oral appliances will likely be ineffective due to severity of OSA. PAP therapy and surgical correction discussed. Pt is not an ideal candidate for oral appliance due to severity of OSA Pt could be a candidate for Hypoglossal nerve stimulation (Inspire therapy) pending DISE results  - Order drug-induced sleep endoscopy  Chronic Nasal Congestion Septal Deviation and Inferior turbinate  hypertrophy Chronic nasal congestion with deviated septum/ITH and significant narrowing of nasal passages on nasal endoscopy today. Symptoms include mouth breathing and nasal obstruction, especially when lying down. Previous nasal sprays (Flonase) ineffective. Septal deviation confirmed. Discussed nasal sprays and surgical options. - Prescribed Nasonex 2 puffs b/l nares + Ocean spray to avoid nasal dryness  - if fails medical management will consider Septo/ITR in the future   Environmental Allergies  - Prescribed Nasonex 2 puffs b/l nares + Ocean spray to avoid nasal dryness - Zyrtec 10 mg daily at bedtime  - will consider allergy testing in the future   Rheumatoid Arthritis Diagnosed in 2001, well-controlled with methotrexate. Reports occasional nausea. No recent exacerbations. Discussed potential impact of methotrexate and Enbrel on wound healing and need to consult rheumatologist before surgery. - Continue methotrexate as prescribed - Consult rheumatologist regarding potential need to stop methotrexate and Enbrel before Inspire implant surgery  General Health Maintenance No history of heart disease or stroke. No current use of aspirin or blood thinners. Regular use of melatonin for sleep. - Continue melatonin as needed for sleep  Follow-up - Follow-up with surgery scheduling team for drug-induced sleep endoscopy - Discuss nasal congestion  findings and potential septoplasty after endoscopy.     Update 07/26/2023 Assessment and Plan    Obstructive Sleep Apnea (OSA)   He is a candidate for Inspire therapy based on PSG and no evidence of complete concentric collapse at the palate during DISE. Preapproved for the procedure done, he was informed about the surgical process, including incision locations and post-operative care. Concerns about work-related physical activity post-surgery were addressed, advising avoidance of strenuous activities for four weeks to prevent device dislodgement, which  could require complex revision surgery. He talked about the safety of flying and MRI compatibility of the device. Post-operative follow-up will be with the sleep physician for device activation and monitoring 4 weeks post-op. The Inspire device senses breathing and stimulates the tongue to prevent airway obstruction, with settings adjusted post-operatively to ensure efficacy.   - Proceed with Inspire implant surgery as scheduled.   - Provide post-operative recovery instructions via MyChart and printed copy.   - Advised to avoid strenuous activities for four weeks post-surgery to prevent device dislodgement.   - Coordinate with HR for work-related leave and restrictions.   - Provide a note for work restrictions if needed.   - Refer to sleep physician for post-operative follow-up and device activation (Dr Robbin Chill Sleep).    Deviated Nasal Septum/ITH and chronic nasal congestion    Exam during initial evaluation: Internal Nose: Septum is deviated to the left with caudal spur on the right and significant narrowing of bilateral nasal passages (S- shaped septum). No polyp, or purulence. Mucosal edema and erythema present.  Bilateral inferior turbinate hypertrophy.   He has an S-shaped septal deviation causing nasal congestion and mouth breathing. Previous trials of nasal sprays and decongestants were unsuccessful, but he plans to retry them post-Inspire surgery. Agreed to retry medical management before considering surgical intervention. Concerned about nasal breathing affecting Inspire device efficacy.   - Prescribed Nasonex 2 puffs b/l nares + Ocean spray to avoid nasal dryness - Zyrtec 10 mg daily at bedtime .    Rheumatoid Arthritis   He is on Enbrel and methotrexate. The rheumatologist advised stopping Enbrel one week before and after surgery to avoid delayed healing. Plans to discuss methotrexate use with the rheumatologist.   - Hold Enbrel one week before and one week after surgery.   -  Consult rheumatologist regarding methotrexate use around the time of surgery.    Follow-up   He will have follow-up appointments for post-operative care and device activation. Plans to see the rheumatologist at the end of the month for routine follow-up and medication management discussion around surgery.   - Schedule post-operative follow-up visit two weeks after surgery.   - Coordinate follow-up with sleep physician for device activation four weeks post-surgery.   - Plan rheumatology follow-up at the end of the month.      Update 07/26/2023 Assessment and Plan Assessment & Plan Postoperative recovery following Inspire surgery is going well One week post-op from [facility] surgery.  - Continue gentle stretching exercises. - Avoid strenuous activities for two weeks. - Refrain from gym activities until four weeks post-op. - Use sunscreen on scars after a few weeks. - Schedule follow-up in four months to assess nasal obstruction and Inspire device usage. - wound care instructions given  Chronic nasal congestion Nasal obstruction Chronic nasal obstruction affects breathing, leading to mouth breathing. Current treatments should continue with medications. - Continue Nasonex and Zyrtec daily - Schedule follow-up in four months to reassess nasal obstruction - will re-attempt nasal endoscopy  when he returns   Work and activity restrictions post-surgery Advised to avoid strenuous activities and heavy lifting for two weeks post-op. Driving permitted with caution to avoid neck, shoulder, and chest strain. - Provide work restriction note: two weeks off, followed by two weeks light duty. - Allow driving but avoid activities straining neck, shoulder, and chest muscles.  Follow-up Follow-up scheduled for Inspire device activation. Advised to monitor for complications. - Attend Inspire device activation on May 22. - Contact office if postoperative complications occur.       Gregory Last,  MD Otolaryngology [facility] Phone: [phone] Fax: [phone]    07/26/2023, 3:53 PM

## 2023-07-31 ENCOUNTER — Encounter (INDEPENDENT_AMBULATORY_CARE_PROVIDER_SITE_OTHER): Payer: Self-pay | Admitting: Otolaryngology

## 2023-08-03 ENCOUNTER — Telehealth (INDEPENDENT_AMBULATORY_CARE_PROVIDER_SITE_OTHER): Payer: Self-pay

## 2023-08-03 NOTE — Telephone Encounter (Signed)
 Patient had questions about his work restrictions with [employer],  per Dr Soldatova, he is to remain on light duty with weight restriction of now more than 5 pounds until his activation date of May 22nd, after activation he can return to full duty with no further restrictions.

## 2023-08-04 ENCOUNTER — Encounter: Payer: Self-pay | Admitting: Otolaryngology

## 2023-08-04 NOTE — Telephone Encounter (Signed)
 See note from 08/03/23

## 2023-08-14 ENCOUNTER — Telehealth (INDEPENDENT_AMBULATORY_CARE_PROVIDER_SITE_OTHER): Payer: Self-pay

## 2023-08-22 NOTE — Telephone Encounter (Signed)
 Patient picked up paperwork.

## 2023-11-24 ENCOUNTER — Encounter (INDEPENDENT_AMBULATORY_CARE_PROVIDER_SITE_OTHER): Payer: Self-pay | Admitting: Otolaryngology

## 2023-11-24 ENCOUNTER — Ambulatory Visit (INDEPENDENT_AMBULATORY_CARE_PROVIDER_SITE_OTHER): Admitting: Otolaryngology

## 2023-11-24 DIAGNOSIS — J3089 Other allergic rhinitis: Secondary | ICD-10-CM

## 2023-11-24 DIAGNOSIS — G4733 Obstructive sleep apnea (adult) (pediatric): Secondary | ICD-10-CM | POA: Diagnosis not present

## 2023-11-24 DIAGNOSIS — J3489 Other specified disorders of nose and nasal sinuses: Secondary | ICD-10-CM

## 2023-11-24 DIAGNOSIS — R0982 Postnasal drip: Secondary | ICD-10-CM

## 2023-11-24 DIAGNOSIS — Z9109 Other allergy status, other than to drugs and biological substances: Secondary | ICD-10-CM

## 2023-11-24 DIAGNOSIS — R0981 Nasal congestion: Secondary | ICD-10-CM

## 2023-11-24 DIAGNOSIS — J343 Hypertrophy of nasal turbinates: Secondary | ICD-10-CM

## 2023-11-24 MED ORDER — CETIRIZINE HCL 10 MG PO TABS: 10.0000 mg | ORAL_TABLET | Freq: Every day | ORAL | 11 refills | Status: AC

## 2023-11-24 MED ORDER — MOMETASONE FUROATE 50 MCG/ACT NA SUSP: 2.0000 | Freq: Every day | NASAL | 12 refills | Status: AC

## 2023-11-24 NOTE — Progress Notes (Signed)
 ENT Progress Note:   Update 11/24/2023  Discussed the use of AI scribe software for clinical note transcription with the patient, who gave verbal consent to proceed.  History of Present Illness Gregory [name] is a 53 year old male with sleep apnea who presents for follow-up on his implant and nasal breathing issues.  He is currently using an implant for his sleep apnea and has progressed to level seven. The stimulation is becoming strong, and he is working on tolerating it. He was on level six for about a month and had to briefly return to level six after moving to level seven. He has been back on level seven since then but has not noticed significant improvement in his symptoms. He feels his tongue being moved forward more but still experiences breathing problems. A repeat sleep study is scheduled for September 16th, with a follow-up on September 30th.  Regarding his nasal breathing, he has started using a nasal spray but has not noticed any improvement. He sometimes forgets to use it at night. He has been taking cetirizine  daily for about a month and a half, starting after July 4th, but has not observed any changes. He is concerned about being a mouth breather and occasionally wakes up with a dry mouth. He is hesitant about undergoing a septoplasty due to concerns about recurrence of obstruction and the potential need for ongoing treatment.   S/p Inspire Implant surgery 07/18/23  Records Reviewed:  Initial Evaluation   Update last OV  Discussed the use of AI scribe software for clinical note transcription with the patient, who gave verbal consent to proceed.  History of Present Illness   The patient presents with questions regarding the upcoming Inspire implant surgery.  He is scheduled for an upcoming Inspire implant surgery 07/18/23 and has several questions regarding the procedure and recovery process. He is concerned about coordinating his work schedule and understanding post-operative  restrictions.   He is concerned about the impact of his current medications on the surgery. He is taking Enbrel and methotrexate for arthritis and plans to discuss this further with his rheumatologist at an upcoming appointment.  He has a history of chronic nasal congestion, deviated septum, allergies and has previously tried nasal sprays and decongestants without success. He plans to revisit this issue after the [facility] surgery, as he is concerned about potential nasal breathing difficulties post-implantation.  He is managing family responsibilities under FMLA due to his mother's care needs following his father's passing last year.      Records Reviewed:  Initial Evaluation  Reason for Consult: OSA CPAP intolerance  Referring provider: Dr Gregory [name] Sleep     HPI: Discussed the use of AI scribe software for clinical note transcription with the patient, who gave verbal consent to proceed.  History of Present Illness   Gregory [name] is a 53 year old male with hx of sleep apnea who presents with CPAP intolerance and nasal breathing issues. He was referred by Dr. Harl for evaluation of sleep apnea and potential Inspire Implant.  He has a history of sleep apnea diagnosed in 2019 and has been intolerant of CPAP therapy. He attempted using a full face mask for six to nine months but experienced claustrophobia, waking up with the mask off, and difficulty returning to sleep due to air pressure. He discontinued CPAP use in 2019.  He is a mouth breather with a history of chronic nasal congestion and a possible deviated septum. He has worsening of baseline nasal  congestion when lying down and occasionally wakes up with one side completely closed. Nasal sprays like Flonase have been ineffective. No prior nasal or sinus procedures.   He has rheumatoid arthritis, diagnosed around 2001, and has been on methotrexate with some nausea as a side effect. His condition has been controlled since  2005-2006. He reports swelling and pain in his hands, wrists, knees, and ankles, but no significant issues currently. Had poor healing in the past when on Enbrel, and stopped it due to poor healing after a leg injury. Currently on methotrexate.   No personal history of heart disease or stroke, although his father had a stroke last year. He uses melatonin almost daily to aid sleep and has no diagnosis of insomnia.      Records Reviewed:  [facility] Sleep Office Visit Dr Gregory 01/02/23 and 11/30/22 52 yoM hx of RA, GERD, BPH, OSA, AHI of 32 on HST 07/2017.  Sleep schedule 10-10:30 pm to 6:30 am. Wakes 1-3 times a night. Interested in [place]. Had a trial of CPAP in 2019 and did not tolerate it.     Past Medical History:  Diagnosis Date   Arthritis    BPH (benign prostatic hyperplasia)    Deviated septum    Dyspnea    GERD (gastroesophageal reflux disease)    Hypercholesterolemia    Osteopenia    Rheumatoid arthritis(714.0)    Sleep apnea    Tibia and fibula open fracture, right     Past Surgical History:  Procedure Laterality Date   DRUG INDUCED ENDOSCOPY N/A 06/02/2023   Procedure: DRUG INDUCED ENDOSCOPY;  Surgeon: Okey Burns, MD;  Location: [facility] OR;  Service: ENT;  Laterality: N/A;   IMPLANTATION OF HYPOGLOSSAL NERVE STIMULATOR N/A 07/18/2023   Procedure: INSERTION, HYPOGLOSSAL NERVE STIMULATOR;  Surgeon: Okey Burns, MD;  Location: [facility];  Service: ENT;  Laterality: N/A;   KNEE ARTHROSCOPY W/ MENISCECTOMY Right    WRIST ARTHROSCOPY Left 2004    Family History  Problem Relation Age of Onset   Hypertension Father    Atrial fibrillation Father    Hyperlipidemia Mother    Heart attack Paternal Grandfather    CAD Cousin    Heart disease Paternal Aunt    Colon cancer Neg Hx    Colon polyps Neg Hx    Esophageal cancer Neg Hx    Stomach cancer Neg Hx    Rectal cancer Neg Hx     Social History:  reports that he has never smoked. He has never used  smokeless tobacco. He reports that he does not drink alcohol and does not use drugs.  Allergies:  Allergies  Allergen Reactions   Amoxicillin Rash   Pravastatin Palpitations   Rosuvastatin Palpitations   Simvastatin Palpitations    Medications: I have reviewed the patient's current medications.  The PMH, PSH, Medications, Allergies, and SH were reviewed and updated.  ROS: Constitutional: Negative for fever, weight loss and weight gain. Cardiovascular: Negative for chest pain and dyspnea on exertion. Respiratory: Is not experiencing shortness of breath at rest. Gastrointestinal: Negative for nausea and vomiting. Neurological: Negative for headaches. Psychiatric: The patient is not nervous/anxious  There were no vitals taken for this visit.  PHYSICAL EXAM:  Exam: General: Well-developed, well-nourished Respiratory Respiratory effort: Equal inspiration and expiration without stridor Cardiovascular Peripheral Vascular: Warm extremities with equal color/perfusion Eyes: No nystagmus with equal extraocular motion bilaterally Neuro/Psych/Balance: Patient oriented to person, place, and time; Appropriate mood and affect; Gait is intact with no imbalance; Cranial  nerves I-XII are intact Head and Face Inspection: Normocephalic and atraumatic without mass or lesion Right submental and chest wall incisions c/d/I/ no fluctuance, no ecchymosis or swelling. Facial Strength: Facial motility symmetric and full bilaterally ENT Pinna: External ear intact and fully developed External canal: Canal is patent with intact skin Tympanic Membrane: Clear and mobile External Nose: No scar or anatomic deformity Chest wall and submental incisions are healed completely no erythema or dehiscence   Studies Reviewed: NPSG in-lab sleep study 02/14/23 BMI 29 AHI of 52.8 (24 obstructive, 4 mixed and 6 central events and 330 hypopneas) ~ 2.7 % of central/mixed events    CT chest 2013 IMPRESSION:   1.  No evidence of interstitial lung disease.  No findings to  explain the patient's shortness of breath.  2.  Tiny left renal stone.   Assessment/Plan: Encounter Diagnoses  Name Primary?   Environmental allergies Yes   Environmental and seasonal allergies    Hypertrophy of both inferior nasal turbinates    Chronic nasal congestion    Post-nasal drip        Assessment and Plan    Obstructive Sleep Apnea Diagnosed in 2019 with initial HST AHI of 30, but more recent in-lab sleep study 02/2023 with AHI of 52.8. CPAP intolerance due to claustrophobia and discomfort. Considering Inspire implant. Discussed procedure, including device placement, drug-induced sleep endoscopy to rule out complete concentric collapse of the soft palate, and potential risks (infection, seroma, hematoma, nerve praxia, lung injury) and benefits (improved sleep apnea management). Advised avoiding strenuous activities for 3-4 weeks post-procedure.  OSA, moderate to severe, without multilevel collapse, with failure to tolerate PAP therapy and/or more conservative measures. Presence of smaller/absent tonsils (2+) and larger tongue position (Friedman 3-4 tongue position of III suggests that hypopharyngeal/retrolingual collapse is contributing to the patient's OSA. Janeth, M et al. Staging of obstructive sleep apnea/hypopnea syndrome: a guide to appropriate treatment. Laryngoscope, 2004 Mar, 114(3):454-9. PMID: [id number]) Options including positional therapy, weight loss, oral appliances will likely be ineffective due to severity of OSA. PAP therapy and surgical correction discussed. Pt is not an ideal candidate for oral appliance due to severity of OSA Pt could be a candidate for Hypoglossal nerve stimulation (Inspire therapy) pending DISE results  - Order drug-induced sleep endoscopy  Chronic Nasal Congestion Septal Deviation and Inferior turbinate hypertrophy Chronic nasal congestion with deviated septum/ITH and  significant narrowing of nasal passages on nasal endoscopy today. Symptoms include mouth breathing and nasal obstruction, especially when lying down. Previous nasal sprays (Flonase) ineffective. Septal deviation confirmed. Discussed nasal sprays and surgical options. - Prescribed Nasonex  2 puffs b/l nares + Ocean spray to avoid nasal dryness  - if fails medical management will consider Septo/ITR in the future   Environmental Allergies  - Prescribed Nasonex  2 puffs b/l nares + Ocean spray to avoid nasal dryness - Zyrtec  10 mg daily at bedtime  - will consider allergy testing in the future   Rheumatoid Arthritis Diagnosed in 2001, well-controlled with methotrexate. Reports occasional nausea. No recent exacerbations. Discussed potential impact of methotrexate and Enbrel on wound healing and need to consult rheumatologist before surgery. - Continue methotrexate as prescribed - Consult rheumatologist regarding potential need to stop methotrexate and Enbrel before Inspire implant surgery  General Health Maintenance No history of heart disease or stroke. No current use of aspirin or blood thinners. Regular use of melatonin for sleep. - Continue melatonin as needed for sleep  Follow-up - Follow-up with surgery scheduling team for drug-induced sleep endoscopy -  Discuss nasal congestion findings and potential septoplasty after endoscopy.     Update 11/24/2023 Assessment and Plan    Obstructive Sleep Apnea (OSA)   He is a candidate for Inspire therapy based on PSG and no evidence of complete concentric collapse at the palate during DISE. Preapproved for the procedure done, he was informed about the surgical process, including incision locations and post-operative care. Concerns about work-related physical activity post-surgery were addressed, advising avoidance of strenuous activities for four weeks to prevent device dislodgement, which could require complex revision surgery. He talked about the safety  of flying and MRI compatibility of the device. Post-operative follow-up will be with the sleep physician for device activation and monitoring 4 weeks post-op. The Inspire device senses breathing and stimulates the tongue to prevent airway obstruction, with settings adjusted post-operatively to ensure efficacy.   - Proceed with Inspire implant surgery as scheduled.   - Provide post-operative recovery instructions via MyChart and printed copy.   - Advised to avoid strenuous activities for four weeks post-surgery to prevent device dislodgement.   - Coordinate with HR for work-related leave and restrictions.   - Provide a note for work restrictions if needed.   - Refer to sleep physician for post-operative follow-up and device activation (Dr Gregory [name] Sleep).    Deviated Nasal Septum/ITH and chronic nasal congestion    Exam during initial evaluation: Internal Nose: Septum is deviated to the left with caudal spur on the right and significant narrowing of bilateral nasal passages (S- shaped septum). No polyp, or purulence. Mucosal edema and erythema present.  Bilateral inferior turbinate hypertrophy.   He has an S-shaped septal deviation causing nasal congestion and mouth breathing. Previous trials of nasal sprays and decongestants were unsuccessful, but he plans to retry them post-Inspire surgery. Agreed to retry medical management before considering surgical intervention. Concerned about nasal breathing affecting Inspire device efficacy.   - Prescribed Nasonex  2 puffs b/l nares + Ocean spray to avoid nasal dryness - Zyrtec  10 mg daily at bedtime .    Rheumatoid Arthritis   He is on Enbrel and methotrexate. The rheumatologist advised stopping Enbrel one week before and after surgery to avoid delayed healing. Plans to discuss methotrexate use with the rheumatologist.   - Hold Enbrel one week before and one week after surgery.   - Consult rheumatologist regarding methotrexate use around the time of  surgery.    Follow-up   He will have follow-up appointments for post-operative care and device activation. Plans to see the rheumatologist at the end of the month for routine follow-up and medication management discussion around surgery.   - Schedule post-operative follow-up visit two weeks after surgery.   - Coordinate follow-up with sleep physician for device activation four weeks post-surgery.   - Plan rheumatology follow-up at the end of the month.      Update 11/24/2023 Assessment and Plan Assessment & Plan Postoperative recovery following Inspire surgery is going well One week post-op from [facility] surgery.  - Continue gentle stretching exercises. - Avoid strenuous activities for two weeks. - Refrain from gym activities until four weeks post-op. - Use sunscreen on scars after a few weeks. - Schedule follow-up in four months to assess nasal obstruction and Inspire device usage. - wound care instructions given  Chronic nasal congestion Nasal obstruction Chronic nasal obstruction affects breathing, leading to mouth breathing. Current treatments should continue with medications. - Continue Nasonex  and Zyrtec  daily - Schedule follow-up in four months to reassess nasal obstruction - will  re-attempt nasal endoscopy when he returns   Work and activity restrictions post-surgery Advised to avoid strenuous activities and heavy lifting for two weeks post-op. Driving permitted with caution to avoid neck, shoulder, and chest strain. - Provide work restriction note: two weeks off, followed by two weeks light duty. - Allow driving but avoid activities straining neck, shoulder, and chest muscles.  Follow-up Follow-up scheduled for Inspire device activation. Advised to monitor for complications. - Attend Inspire device activation on May 22. - Contact office if postoperative complications occur.  Update 11/24/23 Obstructive sleep apnea with hypoglossal nerve stimulator Currently on level seven  of the hypoglossal nerve stimulator. Reports strong stimulation and temporary reversion to level six. Repeat Inspire sleep study scheduled to assess effectiveness. - Conduct repeat Inspire sleep study on September 16th as scheduled   Chronic nasal congestion and nasal obstruction suspect environmental allergies Chronic nasal obstruction persists despite nasal spray and cetirizine . Suspected allergic rhinitis. Discussed septoplasty and allergy testing. Explained allergy shots for desensitization. - Refer to allergy specialist for allergy testing. - Consider allergy shots based on allergy testing results. - If symptoms persist, consider septoplasty, understanding it may not provide permanent relief or obviate a need for shots or nasal spray/antihistamine      Elena Larry, MD Otolaryngology [facility] Phone: [phone] Fax: [phone]    11/24/2023, 12:44 PM

## 2023-12-18 ENCOUNTER — Other Ambulatory Visit: Payer: Self-pay

## 2023-12-18 ENCOUNTER — Encounter: Payer: Self-pay | Admitting: Internal Medicine

## 2023-12-18 ENCOUNTER — Ambulatory Visit (INDEPENDENT_AMBULATORY_CARE_PROVIDER_SITE_OTHER): Payer: Self-pay | Admitting: Internal Medicine

## 2023-12-18 VITALS — BP 122/74 | HR 76 | Temp 98.3°F | Ht 72.0 in | Wt 211.1 lb

## 2023-12-18 DIAGNOSIS — Z880 Allergy status to penicillin: Secondary | ICD-10-CM | POA: Diagnosis not present

## 2023-12-18 DIAGNOSIS — G4733 Obstructive sleep apnea (adult) (pediatric): Secondary | ICD-10-CM | POA: Diagnosis not present

## 2023-12-18 DIAGNOSIS — J342 Deviated nasal septum: Secondary | ICD-10-CM | POA: Diagnosis not present

## 2023-12-18 DIAGNOSIS — J310 Chronic rhinitis: Secondary | ICD-10-CM

## 2023-12-18 NOTE — Progress Notes (Signed)
 NEW PATIENT Date of Service/Encounter:   12/18/2023 Referring provider: Loreli Kins, MD Primary care provider: Loreli Kins, MD  Subjective:  Gregory [name] is a 53 y.o. male with a PMHx of OSA s/p Inspire implant surgery, RA presenting today for evaluation of chronic nasal congestion and inferior turbinate hypertrophy.  History obtained from: chart review and patient.   Discussed the use of AI scribe software for clinical note transcription with the patient, who gave verbal consent to proceed.  History of Present Illness Gregory [name] is a 53 year old male with obstructive sleep apnea who presents with nasal congestion and difficulty breathing through the nose. He was referred by Dr. Solvatova for evaluation of nasal congestion and potential allergies.  Nasal congestion and obstructive symptoms - Persistent nasal congestion and difficulty breathing through the nose for several years - Symptoms worsen when lying down at night, resulting in nostril closure and disrupted sleep - Mouth breathing, suspected to be related to obstructive sleep apnea - No improvement with prior use of allergy medications including cetirizine , Zyrtec , Claritin, and Flonase - Not currently taking any allergy medications  Allergic and atopic history - No history of asthma, eczema, food allergies, or medication allergies except for amoxicillin - Reaction to amoxicillin approximately 20 years ago with rash and swelling; unclear if allergic or drug interaction   Chart Review:  Reviewed PCP notes from referral 11/24/23:referred for allergy testing,  taking zyrtec  and nasonex  and ocean spray; being treated with OSA  Other allergy screening: Asthma: no Food allergy: no Medication allergy: amoxicillin-rash around 20 years ago Hymenoptera allergy: no Eczema:no  Past Medical History: Past Medical History:  Diagnosis Date   Arthritis    BPH (benign prostatic hyperplasia)    Deviated septum     Dyspnea    GERD (gastroesophageal reflux disease)    Hypercholesterolemia    Osteopenia    Rheumatoid arthritis(714.0)    Sleep apnea    Tibia and fibula open fracture, right    Medication List:  Current Outpatient Medications  Medication Sig Dispense Refill   acetaminophen  (TYLENOL  8 HOUR) 650 MG CR tablet Take 1 tablet (650 mg total) by mouth every 8 (eight) hours. (Patient taking differently: 650 mg as needed.) 30 tablet 1   B-D TB SYRINGE 1CC/27GX1/2 27G X 1/2 1 ML MISC USE WITH METHOTREXATE     Calcium Carbonate-Vitamin D 600-400 MG-UNIT tablet Take 1 tablet by mouth daily.     etanercept (ENBREL) 50 MG/ML injection Inject 50 mg into the skin every Thursday.      fenofibrate 160 MG tablet Take 160 mg by mouth daily.     folic acid (FOLVITE) 1 MG tablet Take 3 mg by mouth daily. Patient takes 2 tablets daily and 3 tablets on Thursday and Friday     Methotrexate Sodium (METHOTREXATE, PF,) 50 MG/2ML injection Inject 25 mg into the muscle every Thursday.     Multiple Vitamins-Minerals (MULTIVITAMIN & MINERAL PO) Take 1 tablet by mouth daily.     Omega-3 Fatty Acids (FISH OIL PO) Take 1 capsule by mouth daily.     cetirizine  (ZYRTEC ) 10 MG tablet Take 1 tablet (10 mg total) by mouth daily. (Patient not taking: Reported on 12/18/2023) 30 tablet 11   doxycycline  (VIBRA -TABS) 100 MG tablet Take 1 tablet (100 mg total) by mouth 2 (two) times daily. (Patient not taking: Reported on 12/18/2023) 20 tablet 0   fluvastatin XL (LESCOL XL) 80 MG 24 hr tablet Take 80 mg by mouth daily. (  Patient not taking: Reported on 12/18/2023)     mometasone  (NASONEX ) 50 MCG/ACT nasal spray Place 2 sprays into the nose daily. (Patient not taking: Reported on 12/18/2023) 1 each 12   ondansetron  (ZOFRAN -ODT) 4 MG disintegrating tablet Take 1 tablet (4 mg total) by mouth every 8 (eight) hours as needed for nausea or vomiting. (Patient not taking: Reported on 12/18/2023) 5 tablet 0   oxyCODONE  (ROXICODONE ) 5 MG immediate release  tablet Take 1 tablet (5 mg total) by mouth every 4 (four) hours as needed for severe pain (pain score 7-10) (for pain not controlled with Tylenol  and Motrin). (Patient not taking: Reported on 12/18/2023) 30 tablet 0   senna (SENOKOT) 8.6 MG TABS tablet Take 1 tablet (8.6 mg total) by mouth daily as needed for mild constipation. Take for constipation as needed while on narcotic pain medications (Patient not taking: Reported on 12/18/2023) 120 tablet 0   sodium chloride  (OCEAN) 0.65 % SOLN nasal spray Place 1 spray into both nostrils as needed for congestion. (Patient not taking: Reported on 12/18/2023) 44 mL 0   No current facility-administered medications for this visit.   Known Allergies:  Allergies  Allergen Reactions   Amoxicillin Rash   Pravastatin Palpitations   Rosuvastatin Palpitations   Simvastatin Palpitations   Past Surgical History: Past Surgical History:  Procedure Laterality Date   DRUG INDUCED ENDOSCOPY N/A 06/02/2023   Procedure: DRUG INDUCED ENDOSCOPY;  Surgeon: Okey Burns, MD;  Location: [facility] OR;  Service: ENT;  Laterality: N/A;   IMPLANTATION OF HYPOGLOSSAL NERVE STIMULATOR N/A 07/18/2023   Procedure: INSERTION, HYPOGLOSSAL NERVE STIMULATOR;  Surgeon: Okey Burns, MD;  Location: [facility];  Service: ENT;  Laterality: N/A;   KNEE ARTHROSCOPY W/ MENISCECTOMY Right    WRIST ARTHROSCOPY Left 2004   Family History: Family History  Problem Relation Age of Onset   Hypertension Father    Atrial fibrillation Father    Hyperlipidemia Mother    Heart attack Paternal Grandfather    CAD Cousin    Heart disease Paternal Aunt    Colon cancer Neg Hx    Colon polyps Neg Hx    Esophageal cancer Neg Hx    Stomach cancer Neg Hx    Rectal cancer Neg Hx    Social History: Gregory [name] lives in a house built 42 years ago, no water damage, carpet in the bedroom, no pets, heat pump heating and gas. 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  ROS:  All other systems negative except as noted per HPI.  Objective:  Blood pressure 122/74, pulse 76, temperature 98.3 F (36.8 C), height 6' (1.829 m), weight 211 lb 1.6 oz (95.8 kg), SpO2 97%. Body mass index is 28.63 kg/m. Physical Exam:  General Appearance:  Alert, cooperative, no distress, appears stated age  Head:  Normocephalic, without obvious abnormality, atraumatic  Eyes:  Conjunctiva clear, EOM's intact  Ears EACs normal bilaterally and normal TMs bilaterally  Nose: Nares normal, hypertrophic turbinates, normal mucosa, and no visible anterior polyps, deviated septum  Throat: Lips, tongue normal; teeth and gums normal, normal posterior oropharynx  Neck: Supple, symmetrical  Lungs:   clear to auscultation bilaterally, Respirations unlabored, no coughing  Heart:  regular rate and rhythm and no murmur, Appears well perfused  Extremities: No edema  Skin: Skin color, texture, turgor normal and no rashes or lesions on visualized portions of skin  Neurologic: No gross deficits   Diagnostics: Labs:  Lab  Orders  No laboratory test(s) ordered today     Assessment and Plan  Assessment and Plan Assessment & Plan Chronic nasal obstruction with possible environmental allergies Chronic nasal obstruction with suspected environmental allergies. Symptoms include nasal breathing difficulty, especially when supine, and mouth breathing, potentially contributing to obstructive sleep apnea. Previous trials of cetirizine , Zyrtec , Claritin, and Flonase were ineffective. ENT consultation recommended allergy testing. No family history of allergies, asthma, eczema, or food allergies.  Allergy injections may be considered if environmental allergies are confirmed, requiring prolonged therapy to induce immune tolerance and prevent nasal inflammation. - Schedule allergy testing for environmental allergies on Monday, December 25, 2023, at 1:30 PM. - Ensure no antihistamines are  taken for three days prior to the allergy testing. - continue follow-up with ENT  H/O Penicillin allergy: Previous reaction to amoxicillin with rash and swelling, possibly due to drug interaction. - please schedule follow-up appt at your convenience for penicillin testing followed by graded oral challenge if indicated - please refrain from taking any antihistamines at least 3 days prior to this appointment  - around 80% of individuals outgrow this allergy in ~ 10 years and carrying it as a diagnosis can prevent you from getting proper therapy if needed  Follow up : Monday Spetember 15th at 1;30 PM (must be off all antihistamines 3 days prior to visit) It was a pleasure meeting you in clinic today! Thank you for allowing me to participate in your care.  Rocky Endow, MD [facility]       This note in its entirety was forwarded to the Provider who requested this consultation.  Other: none  Thank you for your kind referral. I appreciate the opportunity to take part in Deaken's care. Please do not hesitate to contact me with questions.  Sincerely,  Rocky Endow, MD [facility] 

## 2023-12-18 NOTE — Patient Instructions (Addendum)
 Chronic nasal obstruction with possible environmental allergies Chronic nasal obstruction with suspected environmental allergies. Symptoms include nasal breathing difficulty, especially when supine, and mouth breathing, potentially contributing to obstructive sleep apnea. Previous trials of cetirizine , Zyrtec , Claritin, and Flonase were ineffective. ENT consultation recommended allergy testing. No family history of allergies, asthma, eczema, or food allergies.  Allergy injections may be considered if environmental allergies are confirmed, requiring prolonged therapy to induce immune tolerance and prevent nasal inflammation. - Schedule allergy testing for environmental allergies on Monday, December 25, 2023, at 1:30 PM. - Ensure no antihistamines are taken for three days prior to the allergy testing. - continue follow-up with ENT  H/O Penicillin allergy: Previous reaction to amoxicillin with rash and swelling, possibly due to drug interaction. - please schedule follow-up appt at your convenience for penicillin testing followed by graded oral challenge if indicated - please refrain from taking any antihistamines at least 3 days prior to this appointment  - around 80% of individuals outgrow this allergy in ~ 10 years and carrying it as a diagnosis can prevent you from getting proper therapy if needed  Follow up : Monday Spetember 15th at 1;30 PM (must be off all antihistamines 3 days prior to visit) It was a pleasure meeting you in clinic today! Thank you for allowing me to participate in your care.  Rocky Endow, MD [facility]

## 2023-12-25 ENCOUNTER — Ambulatory Visit (INDEPENDENT_AMBULATORY_CARE_PROVIDER_SITE_OTHER): Admitting: Internal Medicine

## 2023-12-25 ENCOUNTER — Encounter: Payer: Self-pay | Admitting: Internal Medicine

## 2023-12-25 DIAGNOSIS — J302 Other seasonal allergic rhinitis: Secondary | ICD-10-CM | POA: Diagnosis not present

## 2023-12-25 DIAGNOSIS — J3089 Other allergic rhinitis: Secondary | ICD-10-CM | POA: Diagnosis not present

## 2023-12-25 NOTE — Progress Notes (Signed)
 Date of Service/Encounter:  12/25/23  Allergy  testing appointment   Initial visit on 12/18/23, seen for chronic rhinitis.  Please see that note for additional details.  Today reports for allergy  diagnostic testing:    DIAGNOSTICS:  Skin Testing: Environmental allergy  panel. Adequate positive and negative controls. Results discussed with patient/family.  Airborne Adult Perc - 12/25/23 1323     Time Antigen Placed 1323    Allergen Manufacturer Jestine    Location Back    Number of Test 55    1. Control-Buffer 50% Glycerol Negative    2. Control-Histamine 3+    3. Bahia 3+    4. [place] 3+    5. Johnson 3+    6. [place]  Blue Negative    7. Meadow Fescue 3+    8. Perennial Rye 4+    9. Timothy 3+    10. Ragweed Mix Negative    11. Cocklebur Negative    12. Plantain,  English Negative    13. Baccharis Negative    14. Dog Fennel Negative    15. Russian Thistle Negative    16. Lamb's Quarters Negative    17. Sheep Sorrell Negative    18. Rough Pigweed Negative    19. Marsh Elder, Rough Negative    20. Mugwort, Common Negative    21. Box, Elder Negative    22. Cedar, red 3+    23. Sweet Gum Negative    24. Pecan Pollen Negative    25. Pine Mix Negative    26. Walnut, Black Pollen Negative    27. Red Mulberry Negative    28. Ash Mix Negative    29. Birch Mix Negative    30. Beech American Negative    31. Cottonwood, Guinea[place] Negative    32. Hickory, White Negative    33. Maple Mix Negative    34. Oak, [place] Mix Negative    35. Sycamore Eastern Negative    36. Alternaria Alternata Negative    37. Cladosporium Herbarum Negative    38. Aspergillus Mix Negative    39. Penicillium Mix Negative    40. Bipolaris Sorokiniana (Helminthosporium) Negative    41. Drechslera Spicifera (Curvularia) Negative    42. Mucor Plumbeus Negative    43. Fusarium Moniliforme Negative    44. Aureobasidium Pullulans (pullulara) Negative    45. Rhizopus Oryzae Negative    46. Botrytis  Cinera Negative    47. Epicoccum Nigrum Negative    48. Phoma Betae Negative    49. Dust Mite Mix Negative    50. Cat Hair 10,000 BAU/ml Negative    51.  Dog Epithelia Negative    52. Mixed Feathers Negative    53. Horse Epithelia Negative    54. Cockroach, German 3+    55. Tobacco Leaf Negative          Intradermal - 12/25/23 1424     Time Antigen Placed 1424    Allergen Manufacturer Jestine    Location Arm    Number of Test 12    Control Negative    Ragweed Mix 4+    Weed Mix 3+    Tree Mix 3+    Mold 1 Negative    Mold 2 3+    Mold 3 Negative    Mold 4 Negative    Mite Mix 4+    Cat Negative    Dog 3+          Allergy  testing results were read and interpreted by myself, documented by clinical  staff.  Patient provided with copy of allergy  testing along with avoidance measures when indicated.   Rocky Endow, MD  [facility]  -----------------------------------------------------------------  A/P:  Chronic nasal obstruction with seasonal and perennial allergic rhinitis Chronic nasal obstruction with suspected environmental allergies. Symptoms include nasal breathing difficulty, especially when supine, and mouth breathing, potentially contributing to obstructive sleep apnea. Previous trials of cetirizine , Zyrtec , Claritin, and Flonase were ineffective. ENT consultation recommended allergy  testing. No family history of allergies, asthma, eczema, or food allergies.  Allergy  injections may be considered if environmental allergies are confirmed, requiring prolonged therapy to induce immune tolerance and prevent nasal inflammation. - skin tesing to environmentla allergies are positive 12/25/23 to grass, weed and tree pollen, indoor molds, dust mites, cockroach and dogs; allergen avoidance. -Consider allergy  injections to reduce lifetime symptoms and need for medications by teaching your immune system to become tolerant of the environmental  allergens you are allergic to - continue daily antihistamine. Your options include: Zyrtec  (cetirizine ) 10 mg, Claritin (loratadine) 10 mg, Xyzal (levocetirizine) 5 mg or Allegra (fexofenadine) 180 mg daily as needed - continue follow-up with ENT  H/O Penicillin allergy : Previous reaction to amoxicillin with rash and swelling, possibly due to drug interaction. - please schedule follow-up appt at your convenience for penicillin testing followed by graded oral challenge if indicated - please refrain from taking any antihistamines at least 3 days prior to this appointment  - around 80% of individuals outgrow this allergy  in ~ 10 years and carrying it as a diagnosis can prevent you from getting proper therapy if needed  Follow up : 3 months sooner if needed It was a pleasure seeing you again in clinic today! Thank you for allowing me to participate in your care.

## 2023-12-25 NOTE — Patient Instructions (Signed)
 Chronic nasal obstruction with seasonal and perennial allergic rhinitis Chronic nasal obstruction with suspected environmental allergies. Symptoms include nasal breathing difficulty, especially when supine, and mouth breathing, potentially contributing to obstructive sleep apnea. Previous trials of cetirizine , Zyrtec , Claritin, and Flonase were ineffective. ENT consultation recommended allergy  testing. No family history of allergies, asthma, eczema, or food allergies.  Allergy  injections may be considered if environmental allergies are confirmed, requiring prolonged therapy to induce immune tolerance and prevent nasal inflammation. - skin tesing to environmentla allergies are positive 12/25/23 to grass, weed and tree pollen, indoor molds, dust mites, cockroach and dogs; allergen avoidance. -Consider allergy  injections to reduce lifetime symptoms and need for medications by teaching your immune system to become tolerant of the environmental allergens you are allergic to - continue daily antihistamine. Your options include: Zyrtec  (cetirizine ) 10 mg, Claritin (loratadine) 10 mg, Xyzal (levocetirizine) 5 mg or Allegra (fexofenadine) 180 mg daily as needed - continue follow-up with ENT  H/O Penicillin allergy : Previous reaction to amoxicillin with rash and swelling, possibly due to drug interaction. - please schedule follow-up appt at your convenience for penicillin testing followed by graded oral challenge if indicated - please refrain from taking any antihistamines at least 3 days prior to this appointment  - around 80% of individuals outgrow this allergy  in ~ 10 years and carrying it as a diagnosis can prevent you from getting proper therapy if needed  Follow up : 3 months sooner if needed It was a pleasure seeing you again in clinic today! Thank you for allowing me to participate in your care.  Rocky Endow, MD [facility]  Reducing Pollen Exposure  The American Academy of  Allergy , Asthma and Immunology suggests the following steps to reduce your exposure to pollen during allergy  seasons.    Do not hang sheets or clothing out to dry; pollen may collect on these items. Do not mow lawns or spend time around freshly cut grass; mowing stirs up pollen. Keep windows closed at night.  Keep car windows closed while driving. Minimize morning activities outdoors, a time when pollen counts are usually at their highest. Stay indoors as much as possible when pollen counts or humidity is high and on windy days when pollen tends to remain in the air longer. Use air conditioning when possible.  Many air conditioners have filters that trap the pollen spores. Use a HEPA room air filter to remove pollen form the indoor air you breathe. DUST MITE AVOIDANCE MEASURES:  There are three main measures that need and can be taken to avoid house dust mites:  Reduce accumulation of dust in general -reduce furniture, clothing, carpeting, books, stuffed animals, especially in bedroom  Separate yourself from the dust -use pillow and mattress encasements (can be found at stores such as [employer] or online) -avoid direct exposure to air condition flow -use a HEPA filter device, especially in the bedroom; you can also use a HEPA filter vacuum cleaner -wipe dust with a moist towel instead of a dry towel or broom when cleaning  Decrease mites and/or their secretions -wash clothing and linen and stuffed animals at highest temperature possible, at least every 2 weeks -stuffed animals can also be placed in a bag and put in a freezer overnight  Despite the above measures, it is impossible to eliminate dust mites or their allergen completely from your home.  With the above measures the burden of mites in your home can be diminished, with the goal of  minimizing your allergic symptoms.  Success will be reached only when implementing and using all means together. Control of Mold Allergen    Mold and fungi can grow on a variety of surfaces provided certain temperature and moisture conditions exist.  Outdoor molds grow on plants, decaying vegetation and soil.  The major outdoor mold, Alternaria and Cladosporium, are found in very high numbers during hot and dry conditions.  Generally, a late Summer - Fall peak is seen for common outdoor fungal spores.  Rain will temporarily lower outdoor mold spore count, but counts rise rapidly when the rainy period ends.  The most important indoor molds are Aspergillus and Penicillium.  Dark, humid and poorly ventilated basements are ideal sites for mold growth.  The next most common sites of mold growth are the bathroom and the kitchen.  Indoor (Perennial) Mold Control   Maintain humidity below 50%. Clean washable surfaces with 5% bleach solution. Remove sources e.g. contaminated carpets. Control of Dog or Cat Allergen  Avoidance is the best way to manage a dog or cat allergy . If you have a dog or cat and are allergic to dog or cats, consider removing the dog or cat from the home. If you have a dog or cat but don't want to find it a new home, or if your family wants a pet even though someone in the household is allergic, here are some strategies that may help keep symptoms at bay:  Keep the pet out of your bedroom and restrict it to only a few rooms. Be advised that keeping the dog or cat in only one room will not limit the allergens to that room. Don't pet, hug or kiss the dog or cat; if you do, wash your hands with soap and water. High-efficiency particulate air (HEPA) cleaners run continuously in a bedroom or living room can reduce allergen levels over time. Regular use of a high-efficiency vacuum cleaner or a central vacuum can reduce allergen levels. Giving your dog or cat a bath at least once a week can reduce airborne allergen.

## 2023-12-27 ENCOUNTER — Encounter (INDEPENDENT_AMBULATORY_CARE_PROVIDER_SITE_OTHER): Payer: Self-pay | Admitting: Otolaryngology
